# Patient Record
Sex: Male | Born: 1955
Health system: Southern US, Community
[De-identification: ages and names within clinical notes are randomized; demographics above are authoritative.]

## PROBLEM LIST (undated history)

## (undated) DIAGNOSIS — E785 Hyperlipidemia, unspecified: Secondary | ICD-10-CM

## (undated) DIAGNOSIS — I712 Thoracic aortic aneurysm, without rupture, unspecified: Secondary | ICD-10-CM

## (undated) DIAGNOSIS — Q231 Congenital insufficiency of aortic valve: Secondary | ICD-10-CM

## (undated) DIAGNOSIS — I509 Heart failure, unspecified: Secondary | ICD-10-CM

## (undated) DIAGNOSIS — F419 Anxiety disorder, unspecified: Secondary | ICD-10-CM

## (undated) DIAGNOSIS — E291 Testicular hypofunction: Secondary | ICD-10-CM

## (undated) DIAGNOSIS — Q2381 Bicuspid aortic valve: Secondary | ICD-10-CM

## (undated) DIAGNOSIS — C3491 Malignant neoplasm of unspecified part of right bronchus or lung: Secondary | ICD-10-CM

## (undated) DIAGNOSIS — M51369 Other intervertebral disc degeneration, lumbar region without mention of lumbar back pain or lower extremity pain: Secondary | ICD-10-CM

## (undated) DIAGNOSIS — K219 Gastro-esophageal reflux disease without esophagitis: Secondary | ICD-10-CM

## (undated) DIAGNOSIS — I700 Atherosclerosis of aorta: Secondary | ICD-10-CM

## (undated) DIAGNOSIS — G4700 Insomnia, unspecified: Secondary | ICD-10-CM

## (undated) DIAGNOSIS — I350 Nonrheumatic aortic (valve) stenosis: Secondary | ICD-10-CM

## (undated) DIAGNOSIS — M5136 Other intervertebral disc degeneration, lumbar region: Secondary | ICD-10-CM

## (undated) DIAGNOSIS — B1920 Unspecified viral hepatitis C without hepatic coma: Secondary | ICD-10-CM

## (undated) DIAGNOSIS — I10 Essential (primary) hypertension: Secondary | ICD-10-CM

## (undated) DIAGNOSIS — N529 Male erectile dysfunction, unspecified: Secondary | ICD-10-CM

## (undated) DIAGNOSIS — R011 Cardiac murmur, unspecified: Secondary | ICD-10-CM

## (undated) DIAGNOSIS — J449 Chronic obstructive pulmonary disease, unspecified: Secondary | ICD-10-CM

## (undated) DIAGNOSIS — M1990 Unspecified osteoarthritis, unspecified site: Secondary | ICD-10-CM

## (undated) DIAGNOSIS — H53462 Homonymous bilateral field defects, left side: Secondary | ICD-10-CM

## (undated) DIAGNOSIS — I319 Disease of pericardium, unspecified: Secondary | ICD-10-CM

---

## 1998-09-04 ENCOUNTER — Encounter: Admission: RE | Admit: 1998-09-04 | Discharge: 1998-09-04 | Payer: Self-pay | Admitting: *Deleted

## 1998-11-16 HISTORY — PX: ORCHIECTOMY: SHX2116

## 1999-09-03 ENCOUNTER — Ambulatory Visit (HOSPITAL_COMMUNITY): Admission: RE | Admit: 1999-09-03 | Discharge: 1999-09-03 | Payer: Self-pay | Admitting: Gastroenterology

## 1999-09-03 ENCOUNTER — Encounter: Payer: Self-pay | Admitting: Gastroenterology

## 2001-05-16 HISTORY — PX: AORTIC VALVE REPLACEMENT: SHX41

## 2004-10-22 ENCOUNTER — Encounter: Admission: RE | Admit: 2004-10-22 | Discharge: 2004-10-22 | Payer: Self-pay | Admitting: Internal Medicine

## 2004-11-18 ENCOUNTER — Encounter: Admission: RE | Admit: 2004-11-18 | Discharge: 2004-11-18 | Payer: Self-pay | Admitting: Internal Medicine

## 2006-07-28 ENCOUNTER — Encounter: Admission: RE | Admit: 2006-07-28 | Discharge: 2006-07-28 | Payer: Self-pay | Admitting: Internal Medicine

## 2006-08-06 ENCOUNTER — Other Ambulatory Visit: Payer: Self-pay

## 2006-08-11 ENCOUNTER — Ambulatory Visit: Payer: Self-pay | Admitting: Otolaryngology

## 2007-08-03 ENCOUNTER — Encounter: Admission: RE | Admit: 2007-08-03 | Discharge: 2007-08-03 | Payer: Self-pay | Admitting: Internal Medicine

## 2007-12-15 ENCOUNTER — Encounter: Admission: RE | Admit: 2007-12-15 | Discharge: 2007-12-15 | Payer: Self-pay | Admitting: Internal Medicine

## 2008-11-07 DIAGNOSIS — C4492 Squamous cell carcinoma of skin, unspecified: Secondary | ICD-10-CM

## 2008-11-07 HISTORY — DX: Squamous cell carcinoma of skin, unspecified: C44.92

## 2008-12-11 ENCOUNTER — Encounter: Admission: RE | Admit: 2008-12-11 | Discharge: 2008-12-11 | Payer: Self-pay | Admitting: Internal Medicine

## 2010-02-12 ENCOUNTER — Observation Stay: Payer: Self-pay | Admitting: Internal Medicine

## 2010-03-21 ENCOUNTER — Encounter: Admission: RE | Admit: 2010-03-21 | Discharge: 2010-03-21 | Payer: Self-pay | Admitting: Internal Medicine

## 2010-05-14 ENCOUNTER — Ambulatory Visit (HOSPITAL_COMMUNITY): Admission: RE | Admit: 2010-05-14 | Discharge: 2010-05-14 | Payer: Self-pay | Admitting: Internal Medicine

## 2010-11-13 ENCOUNTER — Ambulatory Visit: Payer: Self-pay | Admitting: Gastroenterology

## 2010-12-07 ENCOUNTER — Encounter: Payer: Self-pay | Admitting: Internal Medicine

## 2010-12-08 ENCOUNTER — Encounter: Payer: Self-pay | Admitting: Internal Medicine

## 2011-03-17 ENCOUNTER — Other Ambulatory Visit: Payer: Self-pay | Admitting: Internal Medicine

## 2011-03-17 DIAGNOSIS — M542 Cervicalgia: Secondary | ICD-10-CM

## 2011-03-20 ENCOUNTER — Ambulatory Visit
Admission: RE | Admit: 2011-03-20 | Discharge: 2011-03-20 | Disposition: A | Discharge status: Home / Self Care | Payer: BC Managed Care – PPO | Source: Ambulatory Visit | Attending: Internal Medicine | Admitting: Internal Medicine

## 2011-03-20 DIAGNOSIS — M542 Cervicalgia: Secondary | ICD-10-CM

## 2011-04-20 ENCOUNTER — Encounter: Payer: Self-pay | Admitting: Neurosurgery

## 2011-05-17 ENCOUNTER — Encounter: Payer: Self-pay | Admitting: Neurosurgery

## 2012-07-13 ENCOUNTER — Other Ambulatory Visit: Payer: Self-pay | Admitting: Internal Medicine

## 2012-07-13 DIAGNOSIS — R739 Hyperglycemia, unspecified: Secondary | ICD-10-CM

## 2012-07-19 ENCOUNTER — Ambulatory Visit
Admission: RE | Admit: 2012-07-19 | Discharge: 2012-07-19 | Disposition: A | Discharge status: Home / Self Care | Payer: BC Managed Care – PPO | Source: Ambulatory Visit | Attending: Internal Medicine | Admitting: Internal Medicine

## 2012-07-19 DIAGNOSIS — R739 Hyperglycemia, unspecified: Secondary | ICD-10-CM

## 2012-08-18 ENCOUNTER — Other Ambulatory Visit: Payer: Self-pay | Admitting: Gastroenterology

## 2012-08-18 DIAGNOSIS — G8929 Other chronic pain: Secondary | ICD-10-CM

## 2012-08-24 ENCOUNTER — Ambulatory Visit
Admission: RE | Admit: 2012-08-24 | Discharge: 2012-08-24 | Disposition: A | Discharge status: Home / Self Care | Payer: BC Managed Care – PPO | Source: Ambulatory Visit | Attending: Gastroenterology | Admitting: Gastroenterology

## 2012-08-24 DIAGNOSIS — R1011 Right upper quadrant pain: Secondary | ICD-10-CM

## 2012-08-24 DIAGNOSIS — G8929 Other chronic pain: Secondary | ICD-10-CM

## 2012-08-24 MED ORDER — GADOBENATE DIMEGLUMINE 529 MG/ML IV SOLN
19.0000 mL | Freq: Once | INTRAVENOUS | Status: AC | PRN
  Administered 2012-08-24: 19 mL via INTRAVENOUS

## 2013-06-01 HISTORY — PX: REPAIR THORACIC AORTA: SUR1211

## 2013-06-01 HISTORY — PX: AORTIC VALVE REPLACEMENT: SHX41

## 2014-05-10 ENCOUNTER — Other Ambulatory Visit: Payer: Self-pay | Admitting: Internal Medicine

## 2014-05-10 DIAGNOSIS — R140 Abdominal distension (gaseous): Secondary | ICD-10-CM

## 2014-05-10 DIAGNOSIS — R142 Eructation: Secondary | ICD-10-CM

## 2014-05-10 DIAGNOSIS — R141 Gas pain: Secondary | ICD-10-CM

## 2014-05-10 DIAGNOSIS — R143 Flatulence: Secondary | ICD-10-CM

## 2014-05-22 ENCOUNTER — Ambulatory Visit
Admission: RE | Admit: 2014-05-22 | Discharge: 2014-05-22 | Disposition: A | Discharge status: Home / Self Care | Payer: BC Managed Care – PPO | Source: Ambulatory Visit | Attending: Internal Medicine | Admitting: Internal Medicine

## 2014-05-22 DIAGNOSIS — R140 Abdominal distension (gaseous): Secondary | ICD-10-CM

## 2014-05-22 DIAGNOSIS — R141 Gas pain: Secondary | ICD-10-CM

## 2014-05-22 DIAGNOSIS — R143 Flatulence: Secondary | ICD-10-CM

## 2014-05-22 DIAGNOSIS — R142 Eructation: Secondary | ICD-10-CM

## 2014-05-24 ENCOUNTER — Other Ambulatory Visit: Payer: BC Managed Care – PPO

## 2014-05-24 DIAGNOSIS — B182 Chronic viral hepatitis C: Secondary | ICD-10-CM

## 2014-05-25 LAB — COMPREHENSIVE METABOLIC PANEL
ALK PHOS: 65 U/L (ref 39–117)
ALT: 36 U/L (ref 0–53)
AST: 30 U/L (ref 0–37)
Albumin: 4.2 g/dL (ref 3.5–5.2)
BILIRUBIN TOTAL: 0.6 mg/dL (ref 0.2–1.2)
BUN: 11 mg/dL (ref 6–23)
CO2: 27 mEq/L (ref 19–32)
Calcium: 9.4 mg/dL (ref 8.4–10.5)
Chloride: 100 mEq/L (ref 96–112)
Creat: 0.81 mg/dL (ref 0.50–1.35)
GLUCOSE: 158 mg/dL — AB (ref 70–99)
Potassium: 3.8 mEq/L (ref 3.5–5.3)
SODIUM: 137 meq/L (ref 135–145)
TOTAL PROTEIN: 7.5 g/dL (ref 6.0–8.3)

## 2014-05-25 LAB — HEPATITIS B SURFACE ANTIGEN: HEP B S AG: NEGATIVE

## 2014-05-25 LAB — ANA: ANA: NEGATIVE

## 2014-05-25 LAB — HEPATITIS B SURFACE ANTIBODY,QUALITATIVE: Hep B S Ab: POSITIVE — AB

## 2014-05-25 LAB — HIV ANTIBODY (ROUTINE TESTING W REFLEX): HIV 1&2 Ab, 4th Generation: NONREACTIVE

## 2014-05-25 LAB — HEPATITIS A ANTIBODY, TOTAL: Hep A Total Ab: NONREACTIVE

## 2014-05-25 LAB — PROTIME-INR
INR: 1.12 (ref <1.50)
Prothrombin Time: 14.4 seconds (ref 11.6–15.2)

## 2014-05-25 LAB — HEPATITIS B CORE ANTIBODY, TOTAL: Hep B Core Total Ab: REACTIVE — AB

## 2014-05-25 LAB — IRON: Iron: 144 ug/dL (ref 42–165)

## 2014-05-28 LAB — HEPATITIS C RNA QUANTITATIVE
HCV QUANT LOG: 4.94 {Log} — AB (ref <1.18)
HCV Quantitative: 86942 IU/mL — ABNORMAL HIGH (ref <15)

## 2014-05-30 LAB — HEPATITIS C GENOTYPE

## 2014-06-20 ENCOUNTER — Encounter: Payer: Self-pay | Admitting: Internal Medicine

## 2014-06-20 ENCOUNTER — Ambulatory Visit (INDEPENDENT_AMBULATORY_CARE_PROVIDER_SITE_OTHER): Payer: BC Managed Care – PPO | Admitting: Internal Medicine

## 2014-06-20 VITALS — BP 143/83 | HR 91 | Temp 98.2°F | Ht 76.0 in | Wt 204.0 lb

## 2014-06-20 DIAGNOSIS — G4700 Insomnia, unspecified: Secondary | ICD-10-CM | POA: Diagnosis not present

## 2014-06-20 DIAGNOSIS — B182 Chronic viral hepatitis C: Secondary | ICD-10-CM | POA: Diagnosis not present

## 2014-06-20 DIAGNOSIS — R911 Solitary pulmonary nodule: Secondary | ICD-10-CM

## 2014-06-20 DIAGNOSIS — J309 Allergic rhinitis, unspecified: Secondary | ICD-10-CM | POA: Insufficient documentation

## 2014-06-20 DIAGNOSIS — E785 Hyperlipidemia, unspecified: Secondary | ICD-10-CM | POA: Diagnosis not present

## 2014-06-20 DIAGNOSIS — J302 Other seasonal allergic rhinitis: Secondary | ICD-10-CM

## 2014-06-20 DIAGNOSIS — I10 Essential (primary) hypertension: Secondary | ICD-10-CM

## 2014-06-20 DIAGNOSIS — J3089 Other allergic rhinitis: Secondary | ICD-10-CM

## 2014-06-20 DIAGNOSIS — E291 Testicular hypofunction: Secondary | ICD-10-CM | POA: Insufficient documentation

## 2014-06-20 DIAGNOSIS — R918 Other nonspecific abnormal finding of lung field: Secondary | ICD-10-CM | POA: Insufficient documentation

## 2014-06-20 DIAGNOSIS — I359 Nonrheumatic aortic valve disorder, unspecified: Secondary | ICD-10-CM

## 2014-06-20 DIAGNOSIS — M545 Low back pain, unspecified: Secondary | ICD-10-CM

## 2014-06-20 NOTE — Progress Notes (Signed)
+  Matthew Kelly is a 58 y.o. male who presents for initial evaluation and management of a positive Hepatitis C antibody test.  Patient tested positive in the 1990s. Test was performed as part of an evaluation of concern for hepatitis. Hepatitis C risk factors present are: snorting drug use. Patient denies accidental needle stick, history of blood transfusion, IV drug abuse, multiple sexual partners, renal dialysis, sexual contact with person with liver disease, tattoos. Patient has had other studies performed. Results: hepatitis C RNA by PCR, result: positive. Patient has had prior treatment for Hepatitis C. Patient does not have a past history of liver disease. Patient does not have a family history of liver disease.   HPI: About 6-7 years ago he was treated with PEG/Riba and took for about 4 weeks and stopped due to side effects.  No PI or other meds.    Patient does not have documented immunity to Hepatitis A. Patient does have documented immunity to Hepatitis B.  Getting both vaccines from his GI doctor.   Review of Systems A comprehensive review of systems was negative.   No past medical history on file.  History  Substance Use Topics  . Smoking status: Current Every Day Smoker -- 0.50 packs/day    Types: Cigarettes    Start date: 11/16/1978  . Smokeless tobacco: Never Used  . Alcohol Use: Yes     Comment: occasional    No family history on file.    Objective:   Filed Vitals:   06/20/14 1410  BP: 143/83  Pulse: 91  Temp: 98.2 F (36.8 C)   in no apparent distress and well developed and well nourished HEENT: anicteric Cor RRR and No murmurs clear Bowel sounds are normal, liver is not enlarged, spleen is not enlarged peripheral pulses normal, no pedal edema, no clubbing or cyanosis negative for - jaundice, spider hemangioma, telangiectasia, palmar erythema, ecchymosis and atrophy  Laboratory Genotype:  Lab Results  Component Value Date   HCVGENOTYPE 1a 05/24/2014    HCV viral load:  Lab Results  Component Value Date   HCVQUANT 76283* 05/24/2014   No results found for this basename: WBC, HGB, HCT, MCV, PLT    Lab Results  Component Value Date   CREATININE 0.81 05/24/2014   BUN 11 05/24/2014   NA 137 05/24/2014   K 3.8 05/24/2014   CL 100 05/24/2014   CO2 27 05/24/2014    Lab Results  Component Value Date   ALT 36 05/24/2014   AST 30 05/24/2014   ALKPHOS 65 05/24/2014   BILITOT 0.6 05/24/2014   INR 1.12 05/24/2014      Assessment: Hepatitis C genotype 1a  Plan: 1) Patient counseled extensively on limiting acetaminophen to no more than 2 grams daily, avoidance of alcohol. 2) Transmission discussed with patient including sexual transmission, sharing razors and toothbrush.   3) Will need referral to gastroenterology: No. 4) Will need referral for substance abuse counseling: No. 5) Will prescribe Harvoni for 12 weeks once work up complete with elasography. 6) Hepatitis A vaccine No. 7) Hepatitis B vaccine No. 8) Pneumovax vaccine No., unless F4.

## 2014-06-20 NOTE — Patient Instructions (Signed)
Date DOB  Dear , As discussed in the [facility], your hepatitis C therapy will include the following medications:          Harvoni 90mg /400mg  tablet:           Take 1 tablet by mouth once daily   Please note that ALL MEDICATIONS WILL START ON THE SAME DATE for a total of 12 weeks. ---------------------------------------------------------------- Your HCV Treatment Start Date: TBA   Your HCV genotype:  1a    Liver Fibrosis:  TBA, awaiting Fibroscan/elastography   ---------------------------------------------------------------- YOUR PHARMACY CONTACT:   [place] [facility] and [place] Phone: [phone] Hours: Monday to Friday 7:30 am to 6:00 pm   Please always contact your pharmacy at least 3-4 business days before you run out of medications to ensure your next month's medication is ready or 1 week prior to running out if you receive it by mail.  Remember, each prescription is for 28 days. ---------------------------------------------------------------- GENERAL NOTES REGARDING YOUR HEPATITIS C MEDICATIONS:  SOFOSBUVIR/LEDIPASVIR (HARVONI): - Harvoni tablet is taken daily with OR without food. - The tablets are orange. - The tablets should be stored at room temperature.  - Acid reducing agents such as H2 blockers (ie. Pepcid (famotidine), Zantac (ranitidine), Tagamet (cimetidine), Axid (nizatidine) and proton pump inhibitors (ie. Prilosec (omeprazole), Protonix (pantoprazole), Nexium (esomeprazole), or Aciphex (rabeprazole)) can decrease effectiveness of Harvoni. Do not take until you have discussed with a health care provider.    -Antacids that contain magnesium and/or aluminum hydroxide (ie. Milk of Magensia, Rolaids, Gaviscon, Maalox, Mylanta, an dArthritis Pain Formula)can reduce absorption of Harvoni, so take them at least 4 hours before or after Harvoni.  -Calcium carbonate (calcium supplements or antacids such as  Tums, Caltrate, Os-Cal)needs to be taken at least 4 hours hours before or after Harvoni.  -St. John's wort or any products that contain St. John's wort like some herbal supplements  Please inform the office prior to starting any of these medications.  - The common side effects with Harvoni:      1. Fatigue      2. Headache      3. Nausea      4. Diarrhea      5. Insomnia   Support Path is a suite of resources designed to help patients start with HARVONI and move toward treatment completion [facility] helps patients access therapy and get off to an efficient start  Benefits investigation and prior authorization support Co-pay and other financial assistance A specialty pharmacy finder CO-PAY COUPON The [place] co-pay coupon may help eligible patients lower their out-of-pocket costs. With a co-pay coupon, most eligible patients may pay no more than $5 per co-pay (restrictions apply) www.harvoni.com call (548) 774-9065 Not valid for patients enrolled in government healthcare prescription drug programs, such as Medicare Part D and Medicaid. Patients in the coverage gap known as the "donut hole" also are not eligible The HARVONI co-pay coupon program will cover the out-of-pocket costs for HARVONI prescriptions up to a maximum of 25% of the catalog price of a 12-week regimen of HARVONI  Please note that this only lists the most common side effects and is NOT a comprehensive list of the potential side effects of these medications. For more information, please review the drug information sheets that come with your medication package from the pharmacy.  ---------------------------------------------------------------- GENERAL HELPFUL HINTS ON HCV THERAPY: 1. No alcohol. 2. Protect against sun-sensitivity/sunburns (wear sunglasses, hat, long sleeves,  pants and sunscreen). 3. Stay well-hydrated/well-moisturized. 4. Notify the [facility] of any changes in your other  over-the-counter/herbal or prescription medications. 5. If you miss a dose of your medication, take the missed dose as soon as you remember. Return to your regular time/dose schedule the next day.  6.  Do not stop taking your medications without first talking with your healthcare provider. 7.  You may take Tylenol (acetaminophen), as long as the dose is less than 2000 mg (OR no more than 4 tablets of the Tylenol Extra Strengths 500mg  tablet) in 24 hours. 8.  You will need to obtain routine labs and/or office visits at [facility] at weeks 2, 4, 8,  and 12 as well as 12 and 24 weeks after completion of treatment.  If you are not compliant with labs and office visits, we may discontinue HCV therapy.  Scharlene Gloss, [place] [place], [place]  [phone]

## 2014-06-26 ENCOUNTER — Ambulatory Visit (HOSPITAL_COMMUNITY): Payer: BC Managed Care – PPO

## 2014-06-28 ENCOUNTER — Ambulatory Visit (HOSPITAL_COMMUNITY)
Admission: RE | Admit: 2014-06-28 | Discharge: 2014-06-28 | Disposition: A | Discharge status: Home / Self Care | Payer: BC Managed Care – PPO | Source: Ambulatory Visit | Attending: Internal Medicine | Admitting: Internal Medicine

## 2014-06-28 DIAGNOSIS — B182 Chronic viral hepatitis C: Secondary | ICD-10-CM | POA: Insufficient documentation

## 2014-06-29 ENCOUNTER — Other Ambulatory Visit: Payer: Self-pay | Admitting: Internal Medicine

## 2014-06-29 ENCOUNTER — Other Ambulatory Visit: Payer: Self-pay | Admitting: Licensed Clinical Social Worker

## 2014-06-29 ENCOUNTER — Telehealth: Payer: Self-pay | Admitting: Licensed Clinical Social Worker

## 2014-06-29 MED ORDER — LEDIPASVIR-SOFOSBUVIR 90-400 MG PO TABS: 1.0000 | ORAL_TABLET | Freq: Every day | ORAL | Status: DC

## 2014-06-29 NOTE — Telephone Encounter (Signed)
Patient had elastography u/s done yesterday and got a call today stating that he could pick the Harvoni up from Outpatient pharmacy. Patient would like Dr. Linus Salmons to call him so he could ask questions about possible side effects and his test results. I explained to the patient about his Fibrosis score but he still wanted to speak with Dr. Linus Salmons.

## 2014-07-19 ENCOUNTER — Ambulatory Visit (INDEPENDENT_AMBULATORY_CARE_PROVIDER_SITE_OTHER): Payer: BC Managed Care – PPO | Admitting: Internal Medicine

## 2014-07-19 ENCOUNTER — Telehealth: Payer: Self-pay | Admitting: *Deleted

## 2014-07-19 ENCOUNTER — Encounter: Payer: Self-pay | Admitting: Internal Medicine

## 2014-07-19 VITALS — BP 123/80 | HR 103 | Temp 98.2°F | Wt 205.0 lb

## 2014-07-19 DIAGNOSIS — B182 Chronic viral hepatitis C: Secondary | ICD-10-CM

## 2014-07-19 DIAGNOSIS — K7460 Unspecified cirrhosis of liver: Secondary | ICD-10-CM

## 2014-07-19 DIAGNOSIS — Z23 Encounter for immunization: Secondary | ICD-10-CM | POA: Diagnosis not present

## 2014-07-19 NOTE — Assessment & Plan Note (Addendum)
His MRI in 2013 suggested cirrhosis but elastography F0.  I will refer back to his GI, Dr. Wynetta Emery to evaluate for cirrhosis.  Should he get a liver biopsy?  Do we think it is cirrhosis with discordant results?

## 2014-07-19 NOTE — Addendum Note (Signed)
Addended by: Myrtis Hopping A on: 07/19/2014 02:54 PM   Modules accepted: Orders

## 2014-07-19 NOTE — Progress Notes (Signed)
   Subjective:    Patient ID: Matthew Kelly, male    DOB: [birth year], 58 y.o.   MRN: [id number]  HPI Here for follow up for Hep C.  Genotype 1a, started Harvoni 15 days ago.  No issues and actually feels better. Was denied coverage for elastography as "experimental".  Was F0 though.  MRCP done by Dr. Wynetta Emery previously though with cirrhosis.  Unclear discordance.     Review of Systems  Constitutional: Negative for fatigue.  Gastrointestinal: Negative for diarrhea and abdominal distention.  Skin: Negative for rash.  Neurological: Negative for dizziness and headaches.       Objective:   Physical Exam  Constitutional: He appears well-developed and well-nourished. No distress.  HENT:  Mouth/Throat: No oropharyngeal exudate.  Eyes: No scleral icterus.  Cardiovascular: Normal rate, regular rhythm and normal heart sounds.   No murmur heard. Pulmonary/Chest: Effort normal and breath sounds normal. No respiratory distress.  Skin: No rash noted.          Assessment & Plan:

## 2014-07-19 NOTE — Telephone Encounter (Signed)
Call placed to [facility], Dr. Wynetta Emery for referral to evaluate for cirrhosis. Had to leave message with referral secretary. Office note faxed to Dr. Inda Merlin and Dr. Wynetta Emery at [facility] [place].

## 2014-07-19 NOTE — Assessment & Plan Note (Signed)
He is doing well on Harvoni, now two weeks in.  Labs in 2 weeks including viral load and will continue for 12 weeks total.  Advised on continued compliance.   For his elastography, though is an FDA test, his insurance has denied it as 'experimental'.  He will appeal.

## 2014-08-02 ENCOUNTER — Other Ambulatory Visit: Payer: BC Managed Care – PPO

## 2014-08-02 DIAGNOSIS — Z113 Encounter for screening for infections with a predominantly sexual mode of transmission: Secondary | ICD-10-CM

## 2014-08-02 DIAGNOSIS — B182 Chronic viral hepatitis C: Secondary | ICD-10-CM

## 2014-08-02 LAB — COMPLETE METABOLIC PANEL WITH GFR
ALK PHOS: 57 U/L (ref 39–117)
ALT: 34 U/L (ref 0–53)
AST: 29 U/L (ref 0–37)
Albumin: 4 g/dL (ref 3.5–5.2)
BILIRUBIN TOTAL: 0.6 mg/dL (ref 0.2–1.2)
BUN: 12 mg/dL (ref 6–23)
CO2: 27 mEq/L (ref 19–32)
Calcium: 9.5 mg/dL (ref 8.4–10.5)
Chloride: 103 mEq/L (ref 96–112)
Creat: 0.9 mg/dL (ref 0.50–1.35)
GFR, Est Non African American: >89 mL/min
GLUCOSE: 113 mg/dL — AB (ref 70–99)
Potassium: 5.3 mEq/L (ref 3.5–5.3)
Sodium: 139 mEq/L (ref 135–145)
Total Protein: 7.5 g/dL (ref 6.0–8.3)

## 2014-08-03 LAB — CBC WITH DIFFERENTIAL/PLATELET
Basophils Absolute: 0 10*3/uL (ref 0.0–0.1)
Basophils Relative: 0 % (ref 0–1)
Eosinophils Absolute: 0.1 10*3/uL (ref 0.0–0.7)
Eosinophils Relative: 1 % (ref 0–5)
HEMATOCRIT: 47.9 % (ref 39.0–52.0)
Hemoglobin: 16.9 g/dL (ref 13.0–17.0)
LYMPHS PCT: 45 % (ref 12–46)
Lymphs Abs: 5 10*3/uL — ABNORMAL HIGH (ref 0.7–4.0)
MCH: 31.1 pg (ref 26.0–34.0)
MCHC: 35.3 g/dL (ref 30.0–36.0)
MCV: 88.1 fL (ref 78.0–100.0)
MONO ABS: 0.7 10*3/uL (ref 0.1–1.0)
Monocytes Relative: 6 % (ref 3–12)
NEUTROS ABS: 5.3 10*3/uL (ref 1.7–7.7)
Neutrophils Relative %: 48 % (ref 43–77)
Platelets: 246 10*3/uL (ref 150–400)
RBC: 5.44 MIL/uL (ref 4.22–5.81)
RDW: 13.9 % (ref 11.5–15.5)
WBC: 11.1 10*3/uL — AB (ref 4.0–10.5)

## 2014-08-04 LAB — HEPATITIS C RNA QUANTITATIVE: HCV Quantitative: NOT DETECTED IU/mL (ref <15)

## 2014-08-09 ENCOUNTER — Ambulatory Visit (INDEPENDENT_AMBULATORY_CARE_PROVIDER_SITE_OTHER): Payer: BC Managed Care – PPO | Admitting: Internal Medicine

## 2014-08-09 ENCOUNTER — Encounter: Payer: Self-pay | Admitting: Internal Medicine

## 2014-08-09 VITALS — BP 143/90 | HR 106 | Temp 98.0°F | Wt 203.0 lb

## 2014-08-09 DIAGNOSIS — Z23 Encounter for immunization: Secondary | ICD-10-CM

## 2014-08-09 DIAGNOSIS — B182 Chronic viral hepatitis C: Secondary | ICD-10-CM

## 2014-08-09 NOTE — Progress Notes (Signed)
   Subjective:    Patient ID: Matthew Kelly, male    DOB: 1956-03-13, 58 y.o.   MRN: [id number]  HPI Here for follow up for Hep C.  Genotype 1a, started Harvoni 6 weeks ago.  No issues and actually feels better. Was denied coverage for elastography as "experimental" but was appealed and seems to have been covered now.  Was F0 though.  MRCP done by Dr. Wynetta Emery previously though with cirrhosis.  Unclear discordance.  To get follow up with Dr. Wynetta Emery, ? Cirrhosis, ? Biopsy    Review of Systems  Constitutional: Negative for fatigue.  Gastrointestinal: Negative for diarrhea and abdominal distention.  Skin: Negative for rash.  Neurological: Negative for dizziness and headaches.       Objective:   Physical Exam  Constitutional: He appears well-developed and well-nourished. No distress.  HENT:  Mouth/Throat: No oropharyngeal exudate.  Eyes: No scleral icterus.  Cardiovascular: Normal rate, regular rhythm and normal heart sounds.   No murmur heard. Pulmonary/Chest: Effort normal and breath sounds normal. No respiratory distress.  Skin: No rash noted.          Assessment & Plan:

## 2014-08-09 NOTE — Assessment & Plan Note (Signed)
Doing great.  Now undetectable.  Will recheck viral load after treatment.  Discussed with pt that cure determined after SVR12.  RTC after labs.

## 2014-09-08 ENCOUNTER — Encounter: Payer: Self-pay | Admitting: *Deleted

## 2014-09-27 ENCOUNTER — Other Ambulatory Visit: Payer: BC Managed Care – PPO

## 2014-09-27 DIAGNOSIS — B182 Chronic viral hepatitis C: Secondary | ICD-10-CM

## 2014-09-28 LAB — HEPATITIS C RNA QUANTITATIVE: HCV Quantitative: NOT DETECTED IU/mL (ref <15)

## 2014-10-04 ENCOUNTER — Ambulatory Visit (INDEPENDENT_AMBULATORY_CARE_PROVIDER_SITE_OTHER): Payer: BC Managed Care – PPO | Admitting: Internal Medicine

## 2014-10-04 ENCOUNTER — Encounter: Payer: Self-pay | Admitting: Internal Medicine

## 2014-10-04 VITALS — BP 129/78 | HR 79 | Temp 98.4°F | Ht 76.0 in | Wt 201.2 lb

## 2014-10-04 DIAGNOSIS — K7460 Unspecified cirrhosis of liver: Secondary | ICD-10-CM

## 2014-10-04 DIAGNOSIS — B182 Chronic viral hepatitis C: Secondary | ICD-10-CM

## 2014-10-04 NOTE — Assessment & Plan Note (Signed)
GI does not feel it is consistent with cirrhosis.

## 2014-10-04 NOTE — Assessment & Plan Note (Signed)
Doing great.  Will check SVR 12 next visit and discussed rebound.  RTC 1 week after lab.

## 2014-10-04 NOTE — Progress Notes (Signed)
   Subjective:    Patient ID: Matthew Kelly, male    DOB: Apr 28, 1956, 57 y.o.   MRN: [id number]  HPI Here for follow up for Hep C.  Genotype 1a, started Harvoni 6 weeks ago.  No issues and actually feels better. Was denied coverage for elastography as "experimental" but was appealed and seems to have been covered now.  Was F0 though.  MRCP done by Dr. Wynetta Emery previously though with cirrhosis.  Unclear discordance.  To get follow up with Dr. Wynetta Emery, ? Cirrhosis, ? Biopsy    Review of Systems  Constitutional: Negative for fatigue.  Gastrointestinal: Negative for diarrhea and abdominal distention.  Skin: Negative for rash.  Neurological: Negative for dizziness and headaches.       Objective:   Physical Exam  Constitutional: He appears well-developed and well-nourished. No distress.  Eyes: No scleral icterus.  Cardiovascular: Normal rate, regular rhythm and normal heart sounds.   No murmur heard. Pulmonary/Chest: Effort normal and breath sounds normal. No respiratory distress.  Skin: No rash noted.          Assessment & Plan:

## 2014-12-24 ENCOUNTER — Other Ambulatory Visit: Payer: BC Managed Care – PPO

## 2014-12-25 ENCOUNTER — Other Ambulatory Visit: Payer: Self-pay

## 2014-12-26 ENCOUNTER — Other Ambulatory Visit: Payer: BLUE CROSS/BLUE SHIELD

## 2014-12-26 DIAGNOSIS — B182 Chronic viral hepatitis C: Secondary | ICD-10-CM

## 2014-12-27 LAB — HEPATITIS C RNA QUANTITATIVE: HCV QUANT: NOT DETECTED [IU]/mL (ref <15)

## 2014-12-29 ENCOUNTER — Encounter: Payer: Self-pay | Admitting: Internal Medicine

## 2015-01-08 ENCOUNTER — Encounter: Payer: Self-pay | Admitting: Internal Medicine

## 2015-01-08 ENCOUNTER — Ambulatory Visit (INDEPENDENT_AMBULATORY_CARE_PROVIDER_SITE_OTHER): Payer: BLUE CROSS/BLUE SHIELD | Admitting: Internal Medicine

## 2015-01-08 VITALS — BP 134/84 | HR 86 | Temp 98.5°F | Ht 76.0 in | Wt 203.0 lb

## 2015-01-08 DIAGNOSIS — B182 Chronic viral hepatitis C: Secondary | ICD-10-CM

## 2015-01-08 NOTE — Assessment & Plan Note (Signed)
He has done well and has completed treatment and is now considered cured. I will have him return in 1 year for follow-up elastography to assure that his elastography score is at the F0 level and not more concerning. I suspect the discordance is an anomaly on the MRI and not truly cirrhosis, particularly with normal platelets and albumin.

## 2015-01-08 NOTE — Progress Notes (Signed)
   Subjective:    Patient ID: Matthew Kelly, male    DOB: 1956/07/18, 59 y.o.   MRN: [id number]  HPI Here for follow up for Hep C.  Genotype 1a, finished Harvoni 3 months ago.  No issues and actually feels better. Was denied coverage for elastography as "experimental" but was appealed and seems to have been covered now.  Was F0 though.  MRCP done by Dr. Wynetta Emery previously though with cirrhosis.  Unclear discordance.  Had follow up with Dr. Wynetta Emery and does not feel it is cirrhosis.     Review of Systems  Constitutional: Negative for fatigue.  Gastrointestinal: Negative for diarrhea and abdominal distention.  Skin: Negative for rash.  Neurological: Negative for dizziness and headaches.       Objective:   Physical Exam  Constitutional: He appears well-developed and well-nourished. No distress.  Eyes: No scleral icterus.  Cardiovascular: Normal rate, regular rhythm and normal heart sounds.   No murmur heard. Pulmonary/Chest: Effort normal and breath sounds normal. No respiratory distress.  Skin: No rash noted.          Assessment & Plan:

## 2015-10-17 ENCOUNTER — Other Ambulatory Visit: Payer: Self-pay | Admitting: Gastroenterology

## 2015-10-17 DIAGNOSIS — R1084 Generalized abdominal pain: Secondary | ICD-10-CM

## 2015-10-25 ENCOUNTER — Ambulatory Visit
Admission: RE | Admit: 2015-10-25 | Discharge: 2015-10-25 | Disposition: A | Discharge status: Home / Self Care | Payer: BLUE CROSS/BLUE SHIELD | Source: Ambulatory Visit | Attending: Gastroenterology | Admitting: Gastroenterology

## 2015-10-25 DIAGNOSIS — R1084 Generalized abdominal pain: Secondary | ICD-10-CM

## 2015-12-10 ENCOUNTER — Telehealth: Payer: Self-pay | Admitting: *Deleted

## 2015-12-10 ENCOUNTER — Other Ambulatory Visit: Payer: Self-pay | Admitting: *Deleted

## 2015-12-10 DIAGNOSIS — B182 Chronic viral hepatitis C: Secondary | ICD-10-CM

## 2015-12-10 NOTE — Telephone Encounter (Signed)
Left message asking patient to call back.  Patient is due for 1 year follow up with Dr. Linus Salmons.  He will first need elastography.  RN asked patient to confirm insurance information for prior authorization of elastography.  Imaging and follow up with Dr Linus Salmons will then need to be scheduled according to the patient's availability. Landis Gandy, RN

## 2016-01-14 ENCOUNTER — Ambulatory Visit (HOSPITAL_COMMUNITY): Payer: BLUE CROSS/BLUE SHIELD

## 2016-01-21 ENCOUNTER — Ambulatory Visit: Payer: BLUE CROSS/BLUE SHIELD | Admitting: Internal Medicine

## 2016-01-29 ENCOUNTER — Ambulatory Visit (HOSPITAL_COMMUNITY)
Admission: RE | Admit: 2016-01-29 | Discharge: 2016-01-29 | Disposition: A | Discharge status: Home / Self Care | Payer: BLUE CROSS/BLUE SHIELD | Source: Ambulatory Visit | Attending: Internal Medicine | Admitting: Internal Medicine

## 2016-01-29 ENCOUNTER — Telehealth: Payer: Self-pay | Admitting: *Deleted

## 2016-01-29 DIAGNOSIS — B182 Chronic viral hepatitis C: Secondary | ICD-10-CM

## 2016-01-29 DIAGNOSIS — N281 Cyst of kidney, acquired: Secondary | ICD-10-CM | POA: Insufficient documentation

## 2016-01-29 DIAGNOSIS — I77811 Abdominal aortic ectasia: Secondary | ICD-10-CM | POA: Diagnosis not present

## 2016-01-29 NOTE — Telephone Encounter (Signed)
Patient had his elastography this morning, next available follow up appointment is 5/2.  As of now he will keep the appointment, but will be looking for results on MyChart. Landis Gandy, RN

## 2016-01-29 NOTE — Telephone Encounter (Signed)
Shows some damage but not concerning and he really won't need further liver follow up.  thanks

## 2016-01-30 NOTE — Telephone Encounter (Signed)
Patient is pleased that he won't need future [facility] management. He will keep his appointment to make sure that all of his questions are answered before he leaves [facility]. He states that he feels Dr. Linus Salmons is "really easy to talk to and will give me the information I need."  When he comes 5/2, he would like a print out of all ultrasound reports and office notes to bring to his PCP for future follow up. Thanks!

## 2016-02-05 ENCOUNTER — Telehealth: Payer: Self-pay | Admitting: *Deleted

## 2016-02-05 NOTE — Telephone Encounter (Signed)
Pt has a question about ultrasound results/Impression regarding the comment about "abdominal aorta."   "Ectatic abdominal aorta at risk for aneurysm development.  Recommend followup by ultrasound in 5 years. This recommendation follows ACR consensus guidelines: White Paper of the ACR Incidental Findings Committee II on Vascular Findings. J Am Coll Radiol 2013; 10:789-794."   Pt would appreciate a call.  He stated that he had heart surgery a couple of years ago for valve replacement and aneurysm repair.  This comment on the U/S findings is concerning to him and he is wondering if there is anything he needs to do now.

## 2016-02-05 NOTE — Telephone Encounter (Signed)
I don't have time to call the patient, nothing concerning and he should talk to his PCP about it. thanks

## 2016-02-11 NOTE — Telephone Encounter (Signed)
Shared Dr. Henreitta Leber comments.

## 2016-02-21 DIAGNOSIS — M545 Low back pain: Secondary | ICD-10-CM | POA: Diagnosis not present

## 2016-02-21 DIAGNOSIS — M47816 Spondylosis without myelopathy or radiculopathy, lumbar region: Secondary | ICD-10-CM | POA: Diagnosis not present

## 2016-02-21 DIAGNOSIS — M25511 Pain in right shoulder: Secondary | ICD-10-CM | POA: Diagnosis not present

## 2016-02-21 DIAGNOSIS — S46911A Strain of unspecified muscle, fascia and tendon at shoulder and upper arm level, right arm, initial encounter: Secondary | ICD-10-CM | POA: Diagnosis not present

## 2016-03-12 ENCOUNTER — Other Ambulatory Visit: Payer: Self-pay | Admitting: Surgery

## 2016-03-12 DIAGNOSIS — S46911D Strain of unspecified muscle, fascia and tendon at shoulder and upper arm level, right arm, subsequent encounter: Secondary | ICD-10-CM

## 2016-03-12 DIAGNOSIS — S39012D Strain of muscle, fascia and tendon of lower back, subsequent encounter: Secondary | ICD-10-CM

## 2016-03-12 DIAGNOSIS — M47816 Spondylosis without myelopathy or radiculopathy, lumbar region: Secondary | ICD-10-CM

## 2016-03-16 ENCOUNTER — Other Ambulatory Visit: Payer: Self-pay | Admitting: Surgery

## 2016-03-16 DIAGNOSIS — M47816 Spondylosis without myelopathy or radiculopathy, lumbar region: Secondary | ICD-10-CM

## 2016-03-16 DIAGNOSIS — S39012D Strain of muscle, fascia and tendon of lower back, subsequent encounter: Secondary | ICD-10-CM

## 2016-03-16 DIAGNOSIS — S46911D Strain of unspecified muscle, fascia and tendon at shoulder and upper arm level, right arm, subsequent encounter: Secondary | ICD-10-CM | POA: Diagnosis not present

## 2016-03-17 ENCOUNTER — Ambulatory Visit: Payer: BLUE CROSS/BLUE SHIELD | Admitting: Internal Medicine

## 2016-03-23 ENCOUNTER — Ambulatory Visit
Admission: RE | Admit: 2016-03-23 | Discharge: 2016-03-23 | Disposition: A | Discharge status: Home / Self Care | Payer: BLUE CROSS/BLUE SHIELD | Source: Ambulatory Visit | Attending: Surgery | Admitting: Surgery

## 2016-03-23 DIAGNOSIS — M75111 Incomplete rotator cuff tear or rupture of right shoulder, not specified as traumatic: Secondary | ICD-10-CM | POA: Diagnosis not present

## 2016-03-23 DIAGNOSIS — M4806 Spinal stenosis, lumbar region: Secondary | ICD-10-CM | POA: Diagnosis not present

## 2016-03-23 DIAGNOSIS — S39012D Strain of muscle, fascia and tendon of lower back, subsequent encounter: Secondary | ICD-10-CM

## 2016-03-23 DIAGNOSIS — S46911D Strain of unspecified muscle, fascia and tendon at shoulder and upper arm level, right arm, subsequent encounter: Secondary | ICD-10-CM

## 2016-03-23 DIAGNOSIS — M47816 Spondylosis without myelopathy or radiculopathy, lumbar region: Secondary | ICD-10-CM

## 2016-03-27 DIAGNOSIS — M7580 Other shoulder lesions, unspecified shoulder: Secondary | ICD-10-CM | POA: Diagnosis not present

## 2016-03-27 DIAGNOSIS — M5416 Radiculopathy, lumbar region: Secondary | ICD-10-CM | POA: Diagnosis not present

## 2016-03-27 DIAGNOSIS — M5126 Other intervertebral disc displacement, lumbar region: Secondary | ICD-10-CM | POA: Diagnosis not present

## 2016-03-30 DIAGNOSIS — M47816 Spondylosis without myelopathy or radiculopathy, lumbar region: Secondary | ICD-10-CM | POA: Diagnosis not present

## 2016-03-30 DIAGNOSIS — S46911D Strain of unspecified muscle, fascia and tendon at shoulder and upper arm level, right arm, subsequent encounter: Secondary | ICD-10-CM | POA: Diagnosis not present

## 2016-03-30 DIAGNOSIS — S39012D Strain of muscle, fascia and tendon of lower back, subsequent encounter: Secondary | ICD-10-CM | POA: Diagnosis not present

## 2016-03-30 DIAGNOSIS — M7581 Other shoulder lesions, right shoulder: Secondary | ICD-10-CM | POA: Diagnosis not present

## 2016-04-10 DIAGNOSIS — I712 Thoracic aortic aneurysm, without rupture: Secondary | ICD-10-CM | POA: Diagnosis not present

## 2016-04-10 DIAGNOSIS — Z8679 Personal history of other diseases of the circulatory system: Secondary | ICD-10-CM | POA: Diagnosis not present

## 2016-04-10 DIAGNOSIS — Z9889 Other specified postprocedural states: Secondary | ICD-10-CM | POA: Diagnosis not present

## 2016-04-21 DIAGNOSIS — M5416 Radiculopathy, lumbar region: Secondary | ICD-10-CM | POA: Diagnosis not present

## 2016-04-21 DIAGNOSIS — M5126 Other intervertebral disc displacement, lumbar region: Secondary | ICD-10-CM | POA: Diagnosis not present

## 2016-05-05 DIAGNOSIS — M5126 Other intervertebral disc displacement, lumbar region: Secondary | ICD-10-CM | POA: Diagnosis not present

## 2016-07-02 DIAGNOSIS — L400 Psoriasis vulgaris: Secondary | ICD-10-CM | POA: Diagnosis not present

## 2016-07-02 DIAGNOSIS — M5126 Other intervertebral disc displacement, lumbar region: Secondary | ICD-10-CM | POA: Diagnosis not present

## 2016-07-02 DIAGNOSIS — M7580 Other shoulder lesions, unspecified shoulder: Secondary | ICD-10-CM | POA: Diagnosis not present

## 2016-07-02 DIAGNOSIS — D1800 Hemangioma unspecified site: Secondary | ICD-10-CM | POA: Diagnosis not present

## 2016-07-02 DIAGNOSIS — R739 Hyperglycemia, unspecified: Secondary | ICD-10-CM | POA: Diagnosis not present

## 2016-07-02 DIAGNOSIS — D229 Melanocytic nevi, unspecified: Secondary | ICD-10-CM | POA: Diagnosis not present

## 2016-07-02 DIAGNOSIS — L720 Epidermal cyst: Secondary | ICD-10-CM | POA: Diagnosis not present

## 2016-07-02 DIAGNOSIS — K589 Irritable bowel syndrome without diarrhea: Secondary | ICD-10-CM | POA: Diagnosis not present

## 2016-07-07 DIAGNOSIS — M5126 Other intervertebral disc displacement, lumbar region: Secondary | ICD-10-CM | POA: Diagnosis not present

## 2016-07-07 DIAGNOSIS — D72820 Lymphocytosis (symptomatic): Secondary | ICD-10-CM | POA: Diagnosis not present

## 2016-07-07 DIAGNOSIS — E291 Testicular hypofunction: Secondary | ICD-10-CM | POA: Diagnosis not present

## 2016-07-07 DIAGNOSIS — R739 Hyperglycemia, unspecified: Secondary | ICD-10-CM | POA: Diagnosis not present

## 2016-07-07 DIAGNOSIS — E782 Mixed hyperlipidemia: Secondary | ICD-10-CM | POA: Diagnosis not present

## 2016-07-28 DIAGNOSIS — Q231 Congenital insufficiency of aortic valve: Secondary | ICD-10-CM | POA: Diagnosis not present

## 2016-08-05 DIAGNOSIS — I359 Nonrheumatic aortic valve disorder, unspecified: Secondary | ICD-10-CM | POA: Diagnosis not present

## 2016-08-05 DIAGNOSIS — E782 Mixed hyperlipidemia: Secondary | ICD-10-CM | POA: Diagnosis not present

## 2016-08-05 DIAGNOSIS — Q231 Congenital insufficiency of aortic valve: Secondary | ICD-10-CM | POA: Diagnosis not present

## 2016-08-05 DIAGNOSIS — I10 Essential (primary) hypertension: Secondary | ICD-10-CM | POA: Diagnosis not present

## 2016-09-14 DIAGNOSIS — M79671 Pain in right foot: Secondary | ICD-10-CM | POA: Diagnosis not present

## 2016-09-14 DIAGNOSIS — S93601A Unspecified sprain of right foot, initial encounter: Secondary | ICD-10-CM | POA: Diagnosis not present

## 2016-09-25 DIAGNOSIS — M7671 Peroneal tendinitis, right leg: Secondary | ICD-10-CM | POA: Diagnosis not present

## 2016-09-25 DIAGNOSIS — M7581 Other shoulder lesions, right shoulder: Secondary | ICD-10-CM | POA: Diagnosis not present

## 2016-09-25 DIAGNOSIS — S46911D Strain of unspecified muscle, fascia and tendon at shoulder and upper arm level, right arm, subsequent encounter: Secondary | ICD-10-CM | POA: Diagnosis not present

## 2016-10-05 DIAGNOSIS — M216X1 Other acquired deformities of right foot: Secondary | ICD-10-CM | POA: Diagnosis not present

## 2016-10-05 DIAGNOSIS — M659 Synovitis and tenosynovitis, unspecified: Secondary | ICD-10-CM | POA: Diagnosis not present

## 2016-10-14 ENCOUNTER — Other Ambulatory Visit: Payer: Self-pay | Admitting: Gastroenterology

## 2016-11-17 DIAGNOSIS — M659 Synovitis and tenosynovitis, unspecified: Secondary | ICD-10-CM | POA: Diagnosis not present

## 2016-11-17 DIAGNOSIS — M216X1 Other acquired deformities of right foot: Secondary | ICD-10-CM | POA: Diagnosis not present

## 2016-12-14 DIAGNOSIS — S90852A Superficial foreign body, left foot, initial encounter: Secondary | ICD-10-CM | POA: Diagnosis not present

## 2016-12-14 DIAGNOSIS — M216X1 Other acquired deformities of right foot: Secondary | ICD-10-CM | POA: Diagnosis not present

## 2016-12-14 DIAGNOSIS — M659 Synovitis and tenosynovitis, unspecified: Secondary | ICD-10-CM | POA: Diagnosis not present

## 2016-12-30 ENCOUNTER — Encounter (HOSPITAL_COMMUNITY): Payer: Self-pay | Admitting: *Deleted

## 2017-01-03 ENCOUNTER — Encounter (HOSPITAL_COMMUNITY): Payer: Self-pay | Admitting: Anesthesiology

## 2017-01-03 NOTE — Anesthesia Preprocedure Evaluation (Addendum)
Anesthesia Evaluation  Patient identified by MRN, date of birth, ID band Patient awake    Reviewed: Allergy & Precautions, NPO status , Patient's Chart, lab work & pertinent test results, reviewed documented beta blocker date and time   Airway Mallampati: I  TM Distance: >3 FB Neck ROM: Full    Dental  (+) Teeth Intact, Dental Advisory Given   Pulmonary Current Smoker,    breath sounds clear to auscultation       Cardiovascular hypertension, Pt. on medications and Pt. on home beta blockers  Rhythm:Regular Rate:Normal     Neuro/Psych negative neurological ROS  negative psych ROS   GI/Hepatic GERD  Medicated,(+) Hepatitis -, C  Endo/Other    Renal/GU   negative genitourinary   Musculoskeletal negative musculoskeletal ROS (+)   Abdominal   Peds negative pediatric ROS (+)  Hematology negative hematology ROS (+)   Anesthesia Other Findings   Reproductive/Obstetrics negative OB ROS                            Anesthesia Physical Anesthesia Plan  ASA: II  Anesthesia Plan: MAC   Post-op Pain Management:    Induction: Intravenous  Airway Management Planned: Natural Airway  Additional Equipment:   Intra-op Plan:   Post-operative Plan:   Informed Consent: I have reviewed the patients History and Physical, chart, labs and discussed the procedure including the risks, benefits and alternatives for the proposed anesthesia with the patient or authorized representative who has indicated his/her understanding and acceptance.     Plan Discussed with: CRNA  Anesthesia Plan Comments:        Anesthesia Quick Evaluation

## 2017-01-04 ENCOUNTER — Ambulatory Visit (HOSPITAL_COMMUNITY)
Admission: RE | Admit: 2017-01-04 | Discharge: 2017-01-04 | Disposition: A | Discharge status: Home / Self Care | Payer: BLUE CROSS/BLUE SHIELD | Source: Ambulatory Visit | Attending: Gastroenterology | Admitting: Gastroenterology

## 2017-01-04 ENCOUNTER — Ambulatory Visit (HOSPITAL_COMMUNITY): Payer: BLUE CROSS/BLUE SHIELD | Admitting: Anesthesiology

## 2017-01-04 ENCOUNTER — Encounter (HOSPITAL_COMMUNITY)
Admission: RE | Disposition: A | Discharge status: Home / Self Care | Payer: Self-pay | Source: Ambulatory Visit | Attending: Gastroenterology

## 2017-01-04 ENCOUNTER — Encounter (HOSPITAL_COMMUNITY): Payer: Self-pay

## 2017-01-04 DIAGNOSIS — Z86010 Personal history of colonic polyps: Secondary | ICD-10-CM | POA: Diagnosis not present

## 2017-01-04 DIAGNOSIS — Z8619 Personal history of other infectious and parasitic diseases: Secondary | ICD-10-CM | POA: Insufficient documentation

## 2017-01-04 DIAGNOSIS — Z7982 Long term (current) use of aspirin: Secondary | ICD-10-CM | POA: Insufficient documentation

## 2017-01-04 DIAGNOSIS — K219 Gastro-esophageal reflux disease without esophagitis: Secondary | ICD-10-CM | POA: Insufficient documentation

## 2017-01-04 DIAGNOSIS — Z953 Presence of xenogenic heart valve: Secondary | ICD-10-CM | POA: Insufficient documentation

## 2017-01-04 DIAGNOSIS — Z1211 Encounter for screening for malignant neoplasm of colon: Secondary | ICD-10-CM | POA: Insufficient documentation

## 2017-01-04 DIAGNOSIS — J309 Allergic rhinitis, unspecified: Secondary | ICD-10-CM | POA: Diagnosis not present

## 2017-01-04 DIAGNOSIS — Z79899 Other long term (current) drug therapy: Secondary | ICD-10-CM | POA: Diagnosis not present

## 2017-01-04 DIAGNOSIS — F17200 Nicotine dependence, unspecified, uncomplicated: Secondary | ICD-10-CM | POA: Diagnosis not present

## 2017-01-04 DIAGNOSIS — I10 Essential (primary) hypertension: Secondary | ICD-10-CM | POA: Diagnosis not present

## 2017-01-04 DIAGNOSIS — E7800 Pure hypercholesterolemia, unspecified: Secondary | ICD-10-CM | POA: Diagnosis not present

## 2017-01-04 HISTORY — DX: Essential (primary) hypertension: I10

## 2017-01-04 HISTORY — PX: COLONOSCOPY WITH PROPOFOL: SHX5780

## 2017-01-04 HISTORY — DX: Unspecified osteoarthritis, unspecified site: M19.90

## 2017-01-04 HISTORY — DX: Cardiac murmur, unspecified: R01.1

## 2017-01-04 HISTORY — DX: Anxiety disorder, unspecified: F41.9

## 2017-01-04 HISTORY — DX: Gastro-esophageal reflux disease without esophagitis: K21.9

## 2017-01-04 SURGERY — COLONOSCOPY WITH PROPOFOL
Anesthesia: Monitor Anesthesia Care

## 2017-01-04 MED ORDER — LACTATED RINGERS IV SOLN
INTRAVENOUS | Status: DC
Start: 2017-01-04 — End: 2017-01-04
  Administered 2017-01-04: 07:00:00 via INTRAVENOUS

## 2017-01-04 MED ORDER — LIDOCAINE 2% (20 MG/ML) 5 ML SYRINGE
INTRAMUSCULAR | Status: DC | PRN
  Administered 2017-01-04: 60 mg via INTRAVENOUS

## 2017-01-04 MED ORDER — PROPOFOL 10 MG/ML IV BOLUS
INTRAVENOUS | Status: DC | PRN
  Administered 2017-01-04 (×2): 30 mg via INTRAVENOUS
  Administered 2017-01-04 (×2): 20 mg via INTRAVENOUS

## 2017-01-04 MED ORDER — SODIUM CHLORIDE 0.9 % IV SOLN: INTRAVENOUS | Status: DC

## 2017-01-04 MED ORDER — PROPOFOL 500 MG/50ML IV EMUL
INTRAVENOUS | Status: DC | PRN
  Administered 2017-01-04: 125 ug/kg/min via INTRAVENOUS

## 2017-01-04 MED ORDER — LIDOCAINE 2% (20 MG/ML) 5 ML SYRINGE
INTRAMUSCULAR | Status: AC
  Filled 2017-01-04: qty 5

## 2017-01-04 MED ORDER — PROPOFOL 10 MG/ML IV BOLUS
INTRAVENOUS | Status: AC
  Filled 2017-01-04: qty 20

## 2017-01-04 MED ORDER — PROPOFOL 10 MG/ML IV BOLUS
INTRAVENOUS | Status: AC
  Filled 2017-01-04: qty 60

## 2017-01-04 SURGICAL SUPPLY — 22 items

## 2017-01-04 NOTE — Op Note (Signed)
[facility] Patient Name: Matthew Kelly Procedure Date: 01/04/2017 MRN: [id number] Attending MD: Garlan Fair , MD Date of Birth: 09-15-1956 CSN: [id number] Age: 61 Admit Type: Outpatient Procedure:                Colonoscopy Indications:              High risk colon cancer surveillance: Personal                            history of adenoma less than 10 mm in size Providers:                Garlan Fair, MD, Jobe Igo, RN, Janie                            Billups, Technician, Dione Booze, CRNA Referring MD:              Medicines:                Propofol per Anesthesia Complications:            No immediate complications. Estimated Blood Loss:     Estimated blood loss was minimal. Procedure:                Pre-Anesthesia Assessment:                           - Prior to the procedure, a History and Physical                            was performed, and patient medications and                            allergies were reviewed. The patient's tolerance of                            previous anesthesia was also reviewed. The risks                            and benefits of the procedure and the sedation                            options and risks were discussed with the patient.                            All questions were answered, and informed consent                            was obtained. Prior Anticoagulants: The patient has                            taken aspirin, last dose was day of procedure. ASA                            Grade Assessment: II - A patient with mild systemic  disease. After reviewing the risks and benefits,                            the patient was deemed in satisfactory condition to                            undergo the procedure.                           After obtaining informed consent, the colonoscope                            was passed under direct vision. Throughout the             procedure, the patient's blood pressure, pulse, and                            oxygen saturations were monitored continuously. The                            EC-[id number] ([id number]) scope was introduced through                            the anus and advanced to the the cecum, identified                            by appendiceal orifice and ileocecal valve. The                            colonoscopy was performed without difficulty. The                            patient tolerated the procedure well. The quality                            of the bowel preparation was adequate. The                            appendiceal orifice and the rectum were                            photographed. Scope In: 8:04:52 AM Scope Out: 8:27:43 AM Scope Withdrawal Time: 0 hours 18 minutes 5 seconds  Total Procedure Duration: 0 hours 22 minutes 51 seconds  Findings:      The perianal and digital rectal examinations were normal.      The entire examined colon appeared normal. Universal colonic       diverticulosis was present. Random colon biopsies were performed from       the right and left colon to look of microscopic colitis Impression:               - The entire examined colon is normal.                           - Random colon biopsies were performed to look for  microscopic clitis. Moderate Sedation:      N/A- Per Anesthesia Care Recommendation:           - Patient has a contact number available for                            emergencies. The signs and symptoms of potential                            delayed complications were discussed with the                            patient. Return to normal activities tomorrow.                            Written discharge instructions were provided to the                            patient.                           - Repeat colonoscopy in 5 years for surveillance.                           - Resume previous diet.                            - Continue present medications. Procedure Code(s):        --- Professional ---                           M3817, Colorectal cancer screening; colonoscopy on                            individual at high risk Diagnosis Code(s):        --- Professional ---                           Z86.010, Personal history of colonic polyps CPT copyright 2016 American Medical Association. All rights reserved. The codes documented in this report are preliminary and upon coder review may  be revised to meet current compliance requirements. Earle Gell, MD Garlan Fair, MD 01/04/2017 8:36:24 AM This report has been signed electronically. Number of Addenda: 0

## 2017-01-04 NOTE — Transfer of Care (Signed)
Immediate Anesthesia Transfer of Care Note  Patient: Enedina Finner  Procedure(s) Performed: Procedure(s): COLONOSCOPY WITH PROPOFOL (N/A)  Patient Location: PACU and Endoscopy Unit  Anesthesia Type:MAC  Level of Consciousness: awake, sedated and patient cooperative  Airway & Oxygen Therapy: Patient Spontanous Breathing and Patient connected to face mask oxygen  Post-op Assessment: Report given to RN and Post -op Vital signs reviewed and stable  Post vital signs: Reviewed and stable  Last Vitals:  Vitals:   01/04/17 0704 01/04/17 0834  BP: 131/77   Pulse: 74 67  Resp: 11 17  Temp: 36.5 C     Last Pain:  Vitals:   01/04/17 0704  TempSrc: Oral         Complications: No apparent anesthesia complications

## 2017-01-04 NOTE — Discharge Instructions (Signed)

## 2017-01-04 NOTE — Anesthesia Postprocedure Evaluation (Addendum)
Anesthesia Post Note  Patient: Matthew Kelly  Procedure(s) Performed: Procedure(s) (LRB): COLONOSCOPY WITH PROPOFOL (N/A)  Patient location during evaluation: PACU Anesthesia Type: MAC Level of consciousness: awake and alert Pain management: pain level controlled Vital Signs Assessment: post-procedure vital signs reviewed and stable Respiratory status: spontaneous breathing, nonlabored ventilation, respiratory function stable and patient connected to nasal cannula oxygen Cardiovascular status: stable and blood pressure returned to baseline Anesthetic complications: no       Last Vitals:  Vitals:   01/04/17 0704 01/04/17 0834  BP: 131/77   Pulse: 74 67  Resp: 11 17  Temp: 36.5 C 36.5 C    Last Pain:  Vitals:   01/04/17 0834  TempSrc: Oral                 Effie Berkshire

## 2017-01-04 NOTE — H&P (Signed)
Procedure: Surveillance colonoscopy. 11/03/2011 normal surveillance colonoscopy was performed. 2007 colonoscopy was performed with removal of small adenomatous colon polyps  History: The patient is a 61 year old male born [birth year]. He is scheduled to undergo a surveillance colonoscopy today. Yesterday, he developed left lower quadrant abdominal pain that lasted approximately 4 hours before completely resolving. He reports no abdominal pain. He has chronic right sided abdominal pain after eating. His abdominal ultrasound did not show gallstones.  Past medical history: Chronic hepatitis C, genotype 1A, cured with harvoni therapy. Hypertension. Hypercholesterolemia. Allergic rhinitis. Bicuspid aortic valve. Bovine aortic valve replacement surgery performed in 2014. Left orchiectomy performed to treat undescended testicle. Sinus surgery.  Exam: The patient is alert and lying comfortably on the endoscopy stretcher. Lungs are clear to auscultation. Cardiac exam reveals a regular rhythm. Abdomen is soft and flat. There is no tenderness to deep palpation patient in all quadrants.  Plan: Proceed with surveillance colonoscopy and screen for microscopic colitis.

## 2017-01-04 NOTE — Anesthesia Procedure Notes (Signed)
Procedure Name: MAC Date/Time: 01/04/2017 7:58 AM Performed by: Dione Booze Pre-anesthesia Checklist: Emergency Drugs available, Patient identified, Suction available and Patient being monitored Patient Re-evaluated:Patient Re-evaluated prior to inductionOxygen Delivery Method: Simple face mask Placement Confirmation: positive ETCO2

## 2017-01-05 ENCOUNTER — Encounter (HOSPITAL_COMMUNITY): Payer: Self-pay | Admitting: Gastroenterology

## 2017-01-06 DIAGNOSIS — Z85828 Personal history of other malignant neoplasm of skin: Secondary | ICD-10-CM | POA: Diagnosis not present

## 2017-01-06 DIAGNOSIS — L400 Psoriasis vulgaris: Secondary | ICD-10-CM | POA: Diagnosis not present

## 2017-01-06 DIAGNOSIS — L570 Actinic keratosis: Secondary | ICD-10-CM | POA: Diagnosis not present

## 2017-01-06 DIAGNOSIS — D1800 Hemangioma unspecified site: Secondary | ICD-10-CM | POA: Diagnosis not present

## 2017-01-06 DIAGNOSIS — L820 Inflamed seborrheic keratosis: Secondary | ICD-10-CM | POA: Diagnosis not present

## 2017-01-06 DIAGNOSIS — B078 Other viral warts: Secondary | ICD-10-CM | POA: Diagnosis not present

## 2017-01-06 DIAGNOSIS — Z1283 Encounter for screening for malignant neoplasm of skin: Secondary | ICD-10-CM | POA: Diagnosis not present

## 2017-01-07 ENCOUNTER — Encounter (HOSPITAL_COMMUNITY): Payer: Self-pay | Admitting: Gastroenterology

## 2017-01-07 NOTE — Addendum Note (Signed)
Addendum  created 01/07/17 0930 by Effie Berkshire, MD   Sign clinical note, SmartForm saved

## 2017-01-15 ENCOUNTER — Other Ambulatory Visit: Payer: Self-pay | Admitting: Internal Medicine

## 2017-01-15 DIAGNOSIS — D72820 Lymphocytosis (symptomatic): Secondary | ICD-10-CM | POA: Diagnosis not present

## 2017-01-15 DIAGNOSIS — R7309 Other abnormal glucose: Secondary | ICD-10-CM | POA: Diagnosis not present

## 2017-01-15 DIAGNOSIS — Z125 Encounter for screening for malignant neoplasm of prostate: Secondary | ICD-10-CM | POA: Diagnosis not present

## 2017-01-15 DIAGNOSIS — L570 Actinic keratosis: Secondary | ICD-10-CM | POA: Diagnosis not present

## 2017-01-15 DIAGNOSIS — E559 Vitamin D deficiency, unspecified: Secondary | ICD-10-CM | POA: Diagnosis not present

## 2017-01-15 DIAGNOSIS — Z0000 Encounter for general adult medical examination without abnormal findings: Secondary | ICD-10-CM | POA: Diagnosis not present

## 2017-01-15 DIAGNOSIS — M7581 Other shoulder lesions, right shoulder: Secondary | ICD-10-CM | POA: Diagnosis not present

## 2017-01-15 DIAGNOSIS — I10 Essential (primary) hypertension: Secondary | ICD-10-CM | POA: Diagnosis not present

## 2017-01-15 DIAGNOSIS — I359 Nonrheumatic aortic valve disorder, unspecified: Secondary | ICD-10-CM | POA: Diagnosis not present

## 2017-01-15 DIAGNOSIS — E291 Testicular hypofunction: Secondary | ICD-10-CM | POA: Diagnosis not present

## 2017-01-15 DIAGNOSIS — Z79899 Other long term (current) drug therapy: Secondary | ICD-10-CM | POA: Diagnosis not present

## 2017-01-15 DIAGNOSIS — E782 Mixed hyperlipidemia: Secondary | ICD-10-CM | POA: Diagnosis not present

## 2017-03-01 DIAGNOSIS — I10 Essential (primary) hypertension: Secondary | ICD-10-CM | POA: Diagnosis not present

## 2017-03-01 DIAGNOSIS — I359 Nonrheumatic aortic valve disorder, unspecified: Secondary | ICD-10-CM | POA: Diagnosis not present

## 2017-03-01 DIAGNOSIS — I5032 Chronic diastolic (congestive) heart failure: Secondary | ICD-10-CM | POA: Diagnosis not present

## 2017-03-01 DIAGNOSIS — I712 Thoracic aortic aneurysm, without rupture: Secondary | ICD-10-CM | POA: Diagnosis not present

## 2017-04-13 DIAGNOSIS — H9319 Tinnitus, unspecified ear: Secondary | ICD-10-CM | POA: Diagnosis not present

## 2017-04-13 DIAGNOSIS — L728 Other follicular cysts of the skin and subcutaneous tissue: Secondary | ICD-10-CM | POA: Diagnosis not present

## 2017-04-13 DIAGNOSIS — H903 Sensorineural hearing loss, bilateral: Secondary | ICD-10-CM | POA: Diagnosis not present

## 2017-04-13 DIAGNOSIS — H6123 Impacted cerumen, bilateral: Secondary | ICD-10-CM | POA: Diagnosis not present

## 2017-04-13 DIAGNOSIS — R42 Dizziness and giddiness: Secondary | ICD-10-CM | POA: Diagnosis not present

## 2017-04-16 NOTE — Addendum Note (Signed)
Addendum  created 04/16/17 1022 by Effie Berkshire, MD   Sign clinical note

## 2017-04-28 DIAGNOSIS — R208 Other disturbances of skin sensation: Secondary | ICD-10-CM | POA: Diagnosis not present

## 2017-04-28 DIAGNOSIS — L720 Epidermal cyst: Secondary | ICD-10-CM | POA: Diagnosis not present

## 2017-04-28 DIAGNOSIS — R234 Changes in skin texture: Secondary | ICD-10-CM | POA: Diagnosis not present

## 2017-06-15 DIAGNOSIS — L720 Epidermal cyst: Secondary | ICD-10-CM | POA: Diagnosis not present

## 2017-06-15 DIAGNOSIS — D179 Benign lipomatous neoplasm, unspecified: Secondary | ICD-10-CM | POA: Diagnosis not present

## 2017-06-22 DIAGNOSIS — L720 Epidermal cyst: Secondary | ICD-10-CM | POA: Diagnosis not present

## 2017-07-21 DIAGNOSIS — M7580 Other shoulder lesions, unspecified shoulder: Secondary | ICD-10-CM | POA: Diagnosis not present

## 2017-07-21 DIAGNOSIS — I10 Essential (primary) hypertension: Secondary | ICD-10-CM | POA: Diagnosis not present

## 2017-07-21 DIAGNOSIS — M5126 Other intervertebral disc displacement, lumbar region: Secondary | ICD-10-CM | POA: Diagnosis not present

## 2017-07-21 DIAGNOSIS — R739 Hyperglycemia, unspecified: Secondary | ICD-10-CM | POA: Diagnosis not present

## 2017-07-21 DIAGNOSIS — B1920 Unspecified viral hepatitis C without hepatic coma: Secondary | ICD-10-CM | POA: Diagnosis not present

## 2017-07-21 DIAGNOSIS — K589 Irritable bowel syndrome without diarrhea: Secondary | ICD-10-CM | POA: Diagnosis not present

## 2017-08-23 DIAGNOSIS — Q231 Congenital insufficiency of aortic valve: Secondary | ICD-10-CM | POA: Diagnosis not present

## 2017-08-30 DIAGNOSIS — S46911D Strain of unspecified muscle, fascia and tendon at shoulder and upper arm level, right arm, subsequent encounter: Secondary | ICD-10-CM | POA: Diagnosis not present

## 2017-08-30 DIAGNOSIS — M7581 Other shoulder lesions, right shoulder: Secondary | ICD-10-CM | POA: Diagnosis not present

## 2017-08-30 DIAGNOSIS — I359 Nonrheumatic aortic valve disorder, unspecified: Secondary | ICD-10-CM | POA: Diagnosis not present

## 2017-08-30 DIAGNOSIS — I712 Thoracic aortic aneurysm, without rupture: Secondary | ICD-10-CM | POA: Diagnosis not present

## 2017-08-30 DIAGNOSIS — I10 Essential (primary) hypertension: Secondary | ICD-10-CM | POA: Diagnosis not present

## 2017-08-30 DIAGNOSIS — Q231 Congenital insufficiency of aortic valve: Secondary | ICD-10-CM | POA: Diagnosis not present

## 2017-09-10 DIAGNOSIS — S46911D Strain of unspecified muscle, fascia and tendon at shoulder and upper arm level, right arm, subsequent encounter: Secondary | ICD-10-CM | POA: Diagnosis not present

## 2017-09-10 DIAGNOSIS — Z23 Encounter for immunization: Secondary | ICD-10-CM | POA: Diagnosis not present

## 2017-09-10 DIAGNOSIS — M7581 Other shoulder lesions, right shoulder: Secondary | ICD-10-CM | POA: Diagnosis not present

## 2017-09-16 DIAGNOSIS — B1920 Unspecified viral hepatitis C without hepatic coma: Secondary | ICD-10-CM | POA: Diagnosis not present

## 2017-09-16 DIAGNOSIS — K219 Gastro-esophageal reflux disease without esophagitis: Secondary | ICD-10-CM | POA: Diagnosis not present

## 2017-09-16 DIAGNOSIS — R1011 Right upper quadrant pain: Secondary | ICD-10-CM | POA: Diagnosis not present

## 2017-10-14 DIAGNOSIS — L720 Epidermal cyst: Secondary | ICD-10-CM | POA: Diagnosis not present

## 2017-10-14 DIAGNOSIS — D2372 Other benign neoplasm of skin of left lower limb, including hip: Secondary | ICD-10-CM | POA: Diagnosis not present

## 2017-10-14 DIAGNOSIS — I788 Other diseases of capillaries: Secondary | ICD-10-CM | POA: Diagnosis not present

## 2017-10-14 DIAGNOSIS — L700 Acne vulgaris: Secondary | ICD-10-CM | POA: Diagnosis not present

## 2017-10-21 DIAGNOSIS — K219 Gastro-esophageal reflux disease without esophagitis: Secondary | ICD-10-CM | POA: Diagnosis not present

## 2017-10-21 DIAGNOSIS — K2970 Gastritis, unspecified, without bleeding: Secondary | ICD-10-CM | POA: Diagnosis not present

## 2017-10-21 DIAGNOSIS — K3189 Other diseases of stomach and duodenum: Secondary | ICD-10-CM | POA: Diagnosis not present

## 2017-10-21 DIAGNOSIS — R1011 Right upper quadrant pain: Secondary | ICD-10-CM | POA: Diagnosis not present

## 2017-12-20 DIAGNOSIS — R1011 Right upper quadrant pain: Secondary | ICD-10-CM | POA: Diagnosis not present

## 2017-12-20 DIAGNOSIS — K2970 Gastritis, unspecified, without bleeding: Secondary | ICD-10-CM | POA: Diagnosis not present

## 2017-12-20 DIAGNOSIS — K589 Irritable bowel syndrome without diarrhea: Secondary | ICD-10-CM | POA: Diagnosis not present

## 2018-01-05 DIAGNOSIS — Z85828 Personal history of other malignant neoplasm of skin: Secondary | ICD-10-CM | POA: Diagnosis not present

## 2018-01-05 DIAGNOSIS — L820 Inflamed seborrheic keratosis: Secondary | ICD-10-CM | POA: Diagnosis not present

## 2018-01-05 DIAGNOSIS — D1800 Hemangioma unspecified site: Secondary | ICD-10-CM | POA: Diagnosis not present

## 2018-01-05 DIAGNOSIS — D485 Neoplasm of uncertain behavior of skin: Secondary | ICD-10-CM | POA: Diagnosis not present

## 2018-01-05 DIAGNOSIS — D235 Other benign neoplasm of skin of trunk: Secondary | ICD-10-CM | POA: Diagnosis not present

## 2018-01-05 DIAGNOSIS — D229 Melanocytic nevi, unspecified: Secondary | ICD-10-CM | POA: Diagnosis not present

## 2018-01-05 DIAGNOSIS — Z1283 Encounter for screening for malignant neoplasm of skin: Secondary | ICD-10-CM | POA: Diagnosis not present

## 2018-01-17 DIAGNOSIS — Z0001 Encounter for general adult medical examination with abnormal findings: Secondary | ICD-10-CM | POA: Diagnosis not present

## 2018-01-17 DIAGNOSIS — Z79899 Other long term (current) drug therapy: Secondary | ICD-10-CM | POA: Diagnosis not present

## 2018-01-17 DIAGNOSIS — E559 Vitamin D deficiency, unspecified: Secondary | ICD-10-CM | POA: Diagnosis not present

## 2018-01-17 DIAGNOSIS — E782 Mixed hyperlipidemia: Secondary | ICD-10-CM | POA: Diagnosis not present

## 2018-01-17 DIAGNOSIS — R739 Hyperglycemia, unspecified: Secondary | ICD-10-CM | POA: Diagnosis not present

## 2018-01-17 DIAGNOSIS — M7580 Other shoulder lesions, unspecified shoulder: Secondary | ICD-10-CM | POA: Diagnosis not present

## 2018-01-17 DIAGNOSIS — Z125 Encounter for screening for malignant neoplasm of prostate: Secondary | ICD-10-CM | POA: Diagnosis not present

## 2018-01-17 DIAGNOSIS — E291 Testicular hypofunction: Secondary | ICD-10-CM | POA: Diagnosis not present

## 2018-01-17 DIAGNOSIS — M549 Dorsalgia, unspecified: Secondary | ICD-10-CM | POA: Diagnosis not present

## 2018-03-11 DIAGNOSIS — Q231 Congenital insufficiency of aortic valve: Secondary | ICD-10-CM | POA: Diagnosis not present

## 2018-03-11 DIAGNOSIS — I10 Essential (primary) hypertension: Secondary | ICD-10-CM | POA: Diagnosis not present

## 2018-03-11 DIAGNOSIS — I359 Nonrheumatic aortic valve disorder, unspecified: Secondary | ICD-10-CM | POA: Diagnosis not present

## 2018-03-11 DIAGNOSIS — I5032 Chronic diastolic (congestive) heart failure: Secondary | ICD-10-CM | POA: Diagnosis not present

## 2018-04-15 DIAGNOSIS — Z95828 Presence of other vascular implants and grafts: Secondary | ICD-10-CM | POA: Diagnosis not present

## 2018-04-15 DIAGNOSIS — Z9889 Other specified postprocedural states: Secondary | ICD-10-CM | POA: Diagnosis not present

## 2018-04-15 DIAGNOSIS — Z8774 Personal history of (corrected) congenital malformations of heart and circulatory system: Secondary | ICD-10-CM | POA: Diagnosis not present

## 2018-04-15 DIAGNOSIS — Z8679 Personal history of other diseases of the circulatory system: Secondary | ICD-10-CM | POA: Diagnosis not present

## 2018-04-15 DIAGNOSIS — Q231 Congenital insufficiency of aortic valve: Secondary | ICD-10-CM | POA: Diagnosis not present

## 2018-04-15 DIAGNOSIS — I712 Thoracic aortic aneurysm, without rupture: Secondary | ICD-10-CM | POA: Diagnosis not present

## 2018-04-15 DIAGNOSIS — I359 Nonrheumatic aortic valve disorder, unspecified: Secondary | ICD-10-CM | POA: Diagnosis not present

## 2018-06-07 ENCOUNTER — Other Ambulatory Visit (HOSPITAL_COMMUNITY): Payer: Self-pay | Admitting: Internal Medicine

## 2018-06-07 ENCOUNTER — Other Ambulatory Visit: Payer: Self-pay | Admitting: Internal Medicine

## 2018-06-07 DIAGNOSIS — R1011 Right upper quadrant pain: Secondary | ICD-10-CM

## 2018-06-07 DIAGNOSIS — R911 Solitary pulmonary nodule: Secondary | ICD-10-CM | POA: Diagnosis not present

## 2018-06-14 ENCOUNTER — Encounter
Admission: RE | Admit: 2018-06-14 | Discharge: 2018-06-14 | Disposition: A | Discharge status: Home / Self Care | Payer: BLUE CROSS/BLUE SHIELD | Source: Ambulatory Visit | Attending: Internal Medicine | Admitting: Internal Medicine

## 2018-06-14 DIAGNOSIS — R1011 Right upper quadrant pain: Secondary | ICD-10-CM | POA: Diagnosis not present

## 2018-06-14 MED ORDER — TECHNETIUM TC 99M MEBROFENIN IV KIT
5.6410 | PACK | Freq: Once | INTRAVENOUS | Status: AC | PRN
  Administered 2018-06-14: 5.641 via INTRAVENOUS

## 2018-06-29 ENCOUNTER — Other Ambulatory Visit: Payer: Self-pay | Admitting: Physician Assistant

## 2018-06-29 DIAGNOSIS — R1011 Right upper quadrant pain: Secondary | ICD-10-CM | POA: Diagnosis not present

## 2018-06-29 DIAGNOSIS — K589 Irritable bowel syndrome without diarrhea: Secondary | ICD-10-CM | POA: Diagnosis not present

## 2018-07-06 ENCOUNTER — Ambulatory Visit
Admission: RE | Admit: 2018-07-06 | Discharge: 2018-07-06 | Disposition: A | Discharge status: Home / Self Care | Payer: BLUE CROSS/BLUE SHIELD | Source: Ambulatory Visit | Attending: Physician Assistant | Admitting: Physician Assistant

## 2018-07-06 DIAGNOSIS — R1011 Right upper quadrant pain: Secondary | ICD-10-CM

## 2018-07-06 DIAGNOSIS — N281 Cyst of kidney, acquired: Secondary | ICD-10-CM | POA: Diagnosis not present

## 2018-07-21 DIAGNOSIS — Z720 Tobacco use: Secondary | ICD-10-CM | POA: Diagnosis not present

## 2018-07-21 DIAGNOSIS — D72820 Lymphocytosis (symptomatic): Secondary | ICD-10-CM | POA: Diagnosis not present

## 2018-07-21 DIAGNOSIS — Z79899 Other long term (current) drug therapy: Secondary | ICD-10-CM | POA: Diagnosis not present

## 2018-07-21 DIAGNOSIS — R1011 Right upper quadrant pain: Secondary | ICD-10-CM | POA: Diagnosis not present

## 2018-07-21 DIAGNOSIS — Z23 Encounter for immunization: Secondary | ICD-10-CM | POA: Diagnosis not present

## 2018-07-21 DIAGNOSIS — I10 Essential (primary) hypertension: Secondary | ICD-10-CM | POA: Diagnosis not present

## 2018-09-16 DIAGNOSIS — I359 Nonrheumatic aortic valve disorder, unspecified: Secondary | ICD-10-CM | POA: Diagnosis not present

## 2018-09-16 DIAGNOSIS — Q231 Congenital insufficiency of aortic valve: Secondary | ICD-10-CM | POA: Diagnosis not present

## 2018-09-16 DIAGNOSIS — I10 Essential (primary) hypertension: Secondary | ICD-10-CM | POA: Diagnosis not present

## 2018-09-16 DIAGNOSIS — I712 Thoracic aortic aneurysm, without rupture: Secondary | ICD-10-CM | POA: Diagnosis not present

## 2018-10-05 DIAGNOSIS — Q231 Congenital insufficiency of aortic valve: Secondary | ICD-10-CM | POA: Diagnosis not present

## 2018-10-26 DIAGNOSIS — Z23 Encounter for immunization: Secondary | ICD-10-CM | POA: Diagnosis not present

## 2018-10-26 DIAGNOSIS — G629 Polyneuropathy, unspecified: Secondary | ICD-10-CM | POA: Diagnosis not present

## 2018-11-24 DIAGNOSIS — D2372 Other benign neoplasm of skin of left lower limb, including hip: Secondary | ICD-10-CM | POA: Diagnosis not present

## 2018-11-24 DIAGNOSIS — L0100 Impetigo, unspecified: Secondary | ICD-10-CM | POA: Diagnosis not present

## 2018-11-24 DIAGNOSIS — L821 Other seborrheic keratosis: Secondary | ICD-10-CM | POA: Diagnosis not present

## 2018-12-16 DIAGNOSIS — H5789 Other specified disorders of eye and adnexa: Secondary | ICD-10-CM | POA: Diagnosis not present

## 2018-12-29 DIAGNOSIS — G629 Polyneuropathy, unspecified: Secondary | ICD-10-CM | POA: Diagnosis not present

## 2018-12-29 DIAGNOSIS — L0100 Impetigo, unspecified: Secondary | ICD-10-CM | POA: Diagnosis not present

## 2018-12-30 DIAGNOSIS — H43811 Vitreous degeneration, right eye: Secondary | ICD-10-CM | POA: Diagnosis not present

## 2019-01-09 DIAGNOSIS — Z1283 Encounter for screening for malignant neoplasm of skin: Secondary | ICD-10-CM | POA: Diagnosis not present

## 2019-01-09 DIAGNOSIS — Z85828 Personal history of other malignant neoplasm of skin: Secondary | ICD-10-CM | POA: Diagnosis not present

## 2019-01-09 DIAGNOSIS — D2230 Melanocytic nevi of unspecified part of face: Secondary | ICD-10-CM | POA: Diagnosis not present

## 2019-01-09 DIAGNOSIS — D225 Melanocytic nevi of trunk: Secondary | ICD-10-CM | POA: Diagnosis not present

## 2019-01-09 DIAGNOSIS — L820 Inflamed seborrheic keratosis: Secondary | ICD-10-CM | POA: Diagnosis not present

## 2019-01-18 DIAGNOSIS — H43813 Vitreous degeneration, bilateral: Secondary | ICD-10-CM | POA: Diagnosis not present

## 2019-01-30 ENCOUNTER — Other Ambulatory Visit: Payer: Self-pay | Admitting: Internal Medicine

## 2019-01-30 ENCOUNTER — Ambulatory Visit
Admission: RE | Admit: 2019-01-30 | Discharge: 2019-01-30 | Disposition: A | Discharge status: Home / Self Care | Payer: BLUE CROSS/BLUE SHIELD | Source: Ambulatory Visit | Attending: Internal Medicine | Admitting: Internal Medicine

## 2019-01-30 DIAGNOSIS — I10 Essential (primary) hypertension: Secondary | ICD-10-CM | POA: Diagnosis not present

## 2019-01-30 DIAGNOSIS — Z125 Encounter for screening for malignant neoplasm of prostate: Secondary | ICD-10-CM | POA: Diagnosis not present

## 2019-01-30 DIAGNOSIS — Z23 Encounter for immunization: Secondary | ICD-10-CM | POA: Diagnosis not present

## 2019-01-30 DIAGNOSIS — Z0000 Encounter for general adult medical examination without abnormal findings: Secondary | ICD-10-CM | POA: Diagnosis not present

## 2019-01-30 DIAGNOSIS — E782 Mixed hyperlipidemia: Secondary | ICD-10-CM | POA: Diagnosis not present

## 2019-01-30 DIAGNOSIS — Z0001 Encounter for general adult medical examination with abnormal findings: Secondary | ICD-10-CM | POA: Diagnosis not present

## 2019-01-30 DIAGNOSIS — R911 Solitary pulmonary nodule: Secondary | ICD-10-CM | POA: Diagnosis not present

## 2019-01-30 DIAGNOSIS — G629 Polyneuropathy, unspecified: Secondary | ICD-10-CM | POA: Diagnosis not present

## 2019-01-30 DIAGNOSIS — R739 Hyperglycemia, unspecified: Secondary | ICD-10-CM | POA: Diagnosis not present

## 2019-01-30 DIAGNOSIS — E291 Testicular hypofunction: Secondary | ICD-10-CM | POA: Diagnosis not present

## 2019-01-30 DIAGNOSIS — Z79899 Other long term (current) drug therapy: Secondary | ICD-10-CM | POA: Diagnosis not present

## 2019-03-20 DIAGNOSIS — Q231 Congenital insufficiency of aortic valve: Secondary | ICD-10-CM | POA: Diagnosis not present

## 2019-03-20 DIAGNOSIS — I712 Thoracic aortic aneurysm, without rupture: Secondary | ICD-10-CM | POA: Diagnosis not present

## 2019-03-20 DIAGNOSIS — I5032 Chronic diastolic (congestive) heart failure: Secondary | ICD-10-CM | POA: Diagnosis not present

## 2019-03-20 DIAGNOSIS — R0602 Shortness of breath: Secondary | ICD-10-CM | POA: Diagnosis not present

## 2019-03-27 DIAGNOSIS — Z1159 Encounter for screening for other viral diseases: Secondary | ICD-10-CM | POA: Diagnosis not present

## 2019-03-30 DIAGNOSIS — R0602 Shortness of breath: Secondary | ICD-10-CM | POA: Diagnosis not present

## 2019-04-18 ENCOUNTER — Other Ambulatory Visit: Payer: Self-pay | Admitting: Cardiology

## 2019-04-18 DIAGNOSIS — I712 Thoracic aortic aneurysm, without rupture, unspecified: Secondary | ICD-10-CM

## 2019-04-18 DIAGNOSIS — Q231 Congenital insufficiency of aortic valve: Secondary | ICD-10-CM | POA: Diagnosis not present

## 2019-04-18 DIAGNOSIS — I359 Nonrheumatic aortic valve disorder, unspecified: Secondary | ICD-10-CM | POA: Diagnosis not present

## 2019-04-18 DIAGNOSIS — I10 Essential (primary) hypertension: Secondary | ICD-10-CM | POA: Diagnosis not present

## 2019-04-20 ENCOUNTER — Other Ambulatory Visit: Payer: Self-pay | Admitting: Internal Medicine

## 2019-04-20 DIAGNOSIS — Z86010 Personal history of colonic polyps: Secondary | ICD-10-CM | POA: Diagnosis not present

## 2019-04-20 DIAGNOSIS — R140 Abdominal distension (gaseous): Secondary | ICD-10-CM

## 2019-04-20 DIAGNOSIS — I359 Nonrheumatic aortic valve disorder, unspecified: Secondary | ICD-10-CM | POA: Diagnosis not present

## 2019-04-20 DIAGNOSIS — I5032 Chronic diastolic (congestive) heart failure: Secondary | ICD-10-CM | POA: Diagnosis not present

## 2019-04-20 DIAGNOSIS — I712 Thoracic aortic aneurysm, without rupture: Secondary | ICD-10-CM | POA: Diagnosis not present

## 2019-04-21 DIAGNOSIS — J449 Chronic obstructive pulmonary disease, unspecified: Secondary | ICD-10-CM | POA: Diagnosis not present

## 2019-04-21 DIAGNOSIS — F17218 Nicotine dependence, cigarettes, with other nicotine-induced disorders: Secondary | ICD-10-CM | POA: Diagnosis not present

## 2019-04-27 ENCOUNTER — Other Ambulatory Visit: Payer: Self-pay

## 2019-04-27 ENCOUNTER — Ambulatory Visit
Admission: RE | Admit: 2019-04-27 | Discharge: 2019-04-27 | Disposition: A | Discharge status: Home / Self Care | Payer: BC Managed Care – PPO | Source: Ambulatory Visit | Attending: Cardiology | Admitting: Cardiology

## 2019-04-27 DIAGNOSIS — I712 Thoracic aortic aneurysm, without rupture, unspecified: Secondary | ICD-10-CM

## 2019-04-27 DIAGNOSIS — R140 Abdominal distension (gaseous): Secondary | ICD-10-CM | POA: Diagnosis not present

## 2019-04-27 DIAGNOSIS — R1011 Right upper quadrant pain: Secondary | ICD-10-CM | POA: Diagnosis not present

## 2019-04-27 DIAGNOSIS — R0602 Shortness of breath: Secondary | ICD-10-CM | POA: Diagnosis not present

## 2019-04-27 LAB — POCT I-STAT CREATININE: Creatinine, Ser: 0.7 mg/dL (ref 0.61–1.24)

## 2019-04-27 MED ORDER — IOPAMIDOL (ISOVUE-370) INJECTION 76%
80.0000 mL | Freq: Once | INTRAVENOUS | Status: AC | PRN
  Administered 2019-04-27: 80 mL via INTRAVENOUS

## 2019-05-02 ENCOUNTER — Ambulatory Visit: Payer: BC Managed Care – PPO

## 2019-05-16 ENCOUNTER — Ambulatory Visit
Admission: RE | Admit: 2019-05-16 | Discharge: 2019-05-16 | Disposition: A | Discharge status: Home / Self Care | Payer: BC Managed Care – PPO | Source: Ambulatory Visit | Attending: Internal Medicine | Admitting: Internal Medicine

## 2019-05-16 ENCOUNTER — Other Ambulatory Visit: Payer: Self-pay | Admitting: Internal Medicine

## 2019-05-16 DIAGNOSIS — R911 Solitary pulmonary nodule: Secondary | ICD-10-CM | POA: Diagnosis not present

## 2019-05-16 DIAGNOSIS — R0789 Other chest pain: Secondary | ICD-10-CM | POA: Diagnosis not present

## 2019-05-16 DIAGNOSIS — R072 Precordial pain: Secondary | ICD-10-CM | POA: Diagnosis not present

## 2019-05-16 DIAGNOSIS — Z720 Tobacco use: Secondary | ICD-10-CM | POA: Diagnosis not present

## 2019-05-16 DIAGNOSIS — I10 Essential (primary) hypertension: Secondary | ICD-10-CM | POA: Diagnosis not present

## 2019-05-26 DIAGNOSIS — K219 Gastro-esophageal reflux disease without esophagitis: Secondary | ICD-10-CM | POA: Diagnosis not present

## 2019-05-26 DIAGNOSIS — H6121 Impacted cerumen, right ear: Secondary | ICD-10-CM | POA: Diagnosis not present

## 2019-05-26 DIAGNOSIS — R490 Dysphonia: Secondary | ICD-10-CM | POA: Diagnosis not present

## 2019-06-09 DIAGNOSIS — K30 Functional dyspepsia: Secondary | ICD-10-CM | POA: Diagnosis not present

## 2019-06-09 DIAGNOSIS — K580 Irritable bowel syndrome with diarrhea: Secondary | ICD-10-CM | POA: Diagnosis not present

## 2019-06-09 DIAGNOSIS — K2970 Gastritis, unspecified, without bleeding: Secondary | ICD-10-CM | POA: Diagnosis not present

## 2019-06-09 DIAGNOSIS — I5032 Chronic diastolic (congestive) heart failure: Secondary | ICD-10-CM | POA: Diagnosis not present

## 2019-06-14 DIAGNOSIS — Z9889 Other specified postprocedural states: Secondary | ICD-10-CM | POA: Diagnosis not present

## 2019-06-14 DIAGNOSIS — Z8679 Personal history of other diseases of the circulatory system: Secondary | ICD-10-CM | POA: Diagnosis not present

## 2019-06-21 DIAGNOSIS — J449 Chronic obstructive pulmonary disease, unspecified: Secondary | ICD-10-CM | POA: Diagnosis not present

## 2019-06-28 DIAGNOSIS — M5136 Other intervertebral disc degeneration, lumbar region: Secondary | ICD-10-CM | POA: Diagnosis not present

## 2019-06-28 DIAGNOSIS — M5416 Radiculopathy, lumbar region: Secondary | ICD-10-CM | POA: Diagnosis not present

## 2019-06-28 DIAGNOSIS — M48062 Spinal stenosis, lumbar region with neurogenic claudication: Secondary | ICD-10-CM | POA: Diagnosis not present

## 2019-06-29 DIAGNOSIS — Q231 Congenital insufficiency of aortic valve: Secondary | ICD-10-CM | POA: Diagnosis not present

## 2019-06-29 DIAGNOSIS — I10 Essential (primary) hypertension: Secondary | ICD-10-CM | POA: Diagnosis not present

## 2019-06-29 DIAGNOSIS — I712 Thoracic aortic aneurysm, without rupture: Secondary | ICD-10-CM | POA: Diagnosis not present

## 2019-06-29 DIAGNOSIS — I359 Nonrheumatic aortic valve disorder, unspecified: Secondary | ICD-10-CM | POA: Diagnosis not present

## 2019-07-17 DIAGNOSIS — M48062 Spinal stenosis, lumbar region with neurogenic claudication: Secondary | ICD-10-CM | POA: Diagnosis not present

## 2019-07-17 DIAGNOSIS — M5136 Other intervertebral disc degeneration, lumbar region: Secondary | ICD-10-CM | POA: Diagnosis not present

## 2019-07-17 DIAGNOSIS — M5441 Lumbago with sciatica, right side: Secondary | ICD-10-CM | POA: Diagnosis not present

## 2019-07-17 DIAGNOSIS — M5416 Radiculopathy, lumbar region: Secondary | ICD-10-CM | POA: Diagnosis not present

## 2019-07-19 ENCOUNTER — Other Ambulatory Visit: Payer: Self-pay | Admitting: Physical Medicine and Rehabilitation

## 2019-07-19 DIAGNOSIS — M5136 Other intervertebral disc degeneration, lumbar region: Secondary | ICD-10-CM

## 2019-07-19 DIAGNOSIS — M5441 Lumbago with sciatica, right side: Secondary | ICD-10-CM

## 2019-07-25 ENCOUNTER — Other Ambulatory Visit: Payer: Self-pay

## 2019-07-25 ENCOUNTER — Ambulatory Visit
Admission: RE | Admit: 2019-07-25 | Discharge: 2019-07-25 | Disposition: A | Discharge status: Home / Self Care | Payer: BC Managed Care – PPO | Source: Ambulatory Visit | Attending: Physical Medicine and Rehabilitation | Admitting: Physical Medicine and Rehabilitation

## 2019-07-25 DIAGNOSIS — M5127 Other intervertebral disc displacement, lumbosacral region: Secondary | ICD-10-CM | POA: Diagnosis not present

## 2019-07-25 DIAGNOSIS — M5136 Other intervertebral disc degeneration, lumbar region: Secondary | ICD-10-CM

## 2019-07-25 DIAGNOSIS — M5441 Lumbago with sciatica, right side: Secondary | ICD-10-CM

## 2019-07-25 DIAGNOSIS — M4807 Spinal stenosis, lumbosacral region: Secondary | ICD-10-CM | POA: Diagnosis not present

## 2019-07-25 DIAGNOSIS — M48061 Spinal stenosis, lumbar region without neurogenic claudication: Secondary | ICD-10-CM

## 2019-07-25 DIAGNOSIS — M5137 Other intervertebral disc degeneration, lumbosacral region: Secondary | ICD-10-CM | POA: Diagnosis not present

## 2019-07-25 HISTORY — DX: Spinal stenosis, lumbar region without neurogenic claudication: M48.061

## 2019-07-31 DIAGNOSIS — M5416 Radiculopathy, lumbar region: Secondary | ICD-10-CM | POA: Diagnosis not present

## 2019-07-31 DIAGNOSIS — M4807 Spinal stenosis, lumbosacral region: Secondary | ICD-10-CM | POA: Diagnosis not present

## 2019-08-18 DIAGNOSIS — M62838 Other muscle spasm: Secondary | ICD-10-CM | POA: Diagnosis not present

## 2019-08-18 DIAGNOSIS — M5136 Other intervertebral disc degeneration, lumbar region: Secondary | ICD-10-CM | POA: Diagnosis not present

## 2019-08-18 DIAGNOSIS — M5416 Radiculopathy, lumbar region: Secondary | ICD-10-CM | POA: Diagnosis not present

## 2019-08-18 DIAGNOSIS — M48062 Spinal stenosis, lumbar region with neurogenic claudication: Secondary | ICD-10-CM | POA: Diagnosis not present

## 2019-08-21 DIAGNOSIS — I10 Essential (primary) hypertension: Secondary | ICD-10-CM | POA: Diagnosis not present

## 2019-08-21 DIAGNOSIS — M5416 Radiculopathy, lumbar region: Secondary | ICD-10-CM | POA: Diagnosis not present

## 2019-08-21 DIAGNOSIS — Z23 Encounter for immunization: Secondary | ICD-10-CM | POA: Diagnosis not present

## 2019-08-21 DIAGNOSIS — E782 Mixed hyperlipidemia: Secondary | ICD-10-CM | POA: Diagnosis not present

## 2019-08-21 DIAGNOSIS — E559 Vitamin D deficiency, unspecified: Secondary | ICD-10-CM | POA: Diagnosis not present

## 2019-08-21 DIAGNOSIS — R739 Hyperglycemia, unspecified: Secondary | ICD-10-CM | POA: Diagnosis not present

## 2019-08-21 DIAGNOSIS — M4807 Spinal stenosis, lumbosacral region: Secondary | ICD-10-CM | POA: Diagnosis not present

## 2019-08-21 DIAGNOSIS — Z720 Tobacco use: Secondary | ICD-10-CM | POA: Diagnosis not present

## 2019-08-21 DIAGNOSIS — E291 Testicular hypofunction: Secondary | ICD-10-CM | POA: Diagnosis not present

## 2019-08-22 DIAGNOSIS — M48061 Spinal stenosis, lumbar region without neurogenic claudication: Secondary | ICD-10-CM | POA: Diagnosis not present

## 2019-09-18 DIAGNOSIS — M48062 Spinal stenosis, lumbar region with neurogenic claudication: Secondary | ICD-10-CM | POA: Diagnosis not present

## 2019-09-18 DIAGNOSIS — M62838 Other muscle spasm: Secondary | ICD-10-CM | POA: Diagnosis not present

## 2019-09-18 DIAGNOSIS — M5416 Radiculopathy, lumbar region: Secondary | ICD-10-CM | POA: Diagnosis not present

## 2019-09-18 DIAGNOSIS — M5136 Other intervertebral disc degeneration, lumbar region: Secondary | ICD-10-CM | POA: Diagnosis not present

## 2019-09-25 DIAGNOSIS — Q231 Congenital insufficiency of aortic valve: Secondary | ICD-10-CM | POA: Diagnosis not present

## 2019-09-25 DIAGNOSIS — M542 Cervicalgia: Secondary | ICD-10-CM | POA: Diagnosis not present

## 2019-10-04 DIAGNOSIS — M542 Cervicalgia: Secondary | ICD-10-CM | POA: Diagnosis not present

## 2019-10-06 DIAGNOSIS — M542 Cervicalgia: Secondary | ICD-10-CM | POA: Diagnosis not present

## 2019-10-11 DIAGNOSIS — M542 Cervicalgia: Secondary | ICD-10-CM | POA: Diagnosis not present

## 2019-10-17 DIAGNOSIS — M542 Cervicalgia: Secondary | ICD-10-CM | POA: Diagnosis not present

## 2019-10-19 DIAGNOSIS — M542 Cervicalgia: Secondary | ICD-10-CM | POA: Diagnosis not present

## 2019-10-23 DIAGNOSIS — M542 Cervicalgia: Secondary | ICD-10-CM | POA: Diagnosis not present

## 2019-10-27 DIAGNOSIS — M542 Cervicalgia: Secondary | ICD-10-CM | POA: Diagnosis not present

## 2019-10-30 DIAGNOSIS — M542 Cervicalgia: Secondary | ICD-10-CM | POA: Diagnosis not present

## 2019-11-03 DIAGNOSIS — M542 Cervicalgia: Secondary | ICD-10-CM | POA: Diagnosis not present

## 2019-11-07 DIAGNOSIS — M542 Cervicalgia: Secondary | ICD-10-CM | POA: Diagnosis not present

## 2019-11-14 DIAGNOSIS — M542 Cervicalgia: Secondary | ICD-10-CM | POA: Diagnosis not present

## 2019-12-04 DIAGNOSIS — M48062 Spinal stenosis, lumbar region with neurogenic claudication: Secondary | ICD-10-CM | POA: Diagnosis not present

## 2019-12-04 DIAGNOSIS — M5416 Radiculopathy, lumbar region: Secondary | ICD-10-CM | POA: Diagnosis not present

## 2019-12-04 DIAGNOSIS — M62838 Other muscle spasm: Secondary | ICD-10-CM | POA: Diagnosis not present

## 2019-12-04 DIAGNOSIS — M5136 Other intervertebral disc degeneration, lumbar region: Secondary | ICD-10-CM | POA: Diagnosis not present

## 2019-12-06 DIAGNOSIS — M5136 Other intervertebral disc degeneration, lumbar region: Secondary | ICD-10-CM | POA: Diagnosis not present

## 2019-12-06 DIAGNOSIS — M62838 Other muscle spasm: Secondary | ICD-10-CM | POA: Diagnosis not present

## 2019-12-18 DIAGNOSIS — Z1283 Encounter for screening for malignant neoplasm of skin: Secondary | ICD-10-CM | POA: Diagnosis not present

## 2019-12-18 DIAGNOSIS — L0291 Cutaneous abscess, unspecified: Secondary | ICD-10-CM | POA: Diagnosis not present

## 2019-12-18 DIAGNOSIS — L821 Other seborrheic keratosis: Secondary | ICD-10-CM | POA: Diagnosis not present

## 2019-12-18 DIAGNOSIS — L02818 Cutaneous abscess of other sites: Secondary | ICD-10-CM | POA: Diagnosis not present

## 2019-12-18 DIAGNOSIS — L720 Epidermal cyst: Secondary | ICD-10-CM | POA: Diagnosis not present

## 2019-12-18 DIAGNOSIS — D0472 Carcinoma in situ of skin of left lower limb, including hip: Secondary | ICD-10-CM | POA: Diagnosis not present

## 2019-12-18 DIAGNOSIS — D485 Neoplasm of uncertain behavior of skin: Secondary | ICD-10-CM | POA: Diagnosis not present

## 2020-01-08 DIAGNOSIS — L578 Other skin changes due to chronic exposure to nonionizing radiation: Secondary | ICD-10-CM | POA: Diagnosis not present

## 2020-01-08 DIAGNOSIS — Z872 Personal history of diseases of the skin and subcutaneous tissue: Secondary | ICD-10-CM | POA: Diagnosis not present

## 2020-01-08 DIAGNOSIS — D0472 Carcinoma in situ of skin of left lower limb, including hip: Secondary | ICD-10-CM | POA: Diagnosis not present

## 2020-01-09 DIAGNOSIS — I10 Essential (primary) hypertension: Secondary | ICD-10-CM | POA: Diagnosis not present

## 2020-01-09 DIAGNOSIS — Q231 Congenital insufficiency of aortic valve: Secondary | ICD-10-CM | POA: Diagnosis not present

## 2020-01-09 DIAGNOSIS — I359 Nonrheumatic aortic valve disorder, unspecified: Secondary | ICD-10-CM | POA: Diagnosis not present

## 2020-01-09 DIAGNOSIS — I712 Thoracic aortic aneurysm, without rupture: Secondary | ICD-10-CM | POA: Diagnosis not present

## 2020-01-18 DIAGNOSIS — H2513 Age-related nuclear cataract, bilateral: Secondary | ICD-10-CM | POA: Diagnosis not present

## 2020-02-06 ENCOUNTER — Telehealth: Payer: Self-pay

## 2020-02-06 NOTE — Telephone Encounter (Signed)
Pt called he is concerned that his L lat ankle where he a SCCis removed 12-18-19 is not healing, pt will email a photo here today, pt using otc hydrogen peroxide and Mupirocin ointment.  Discussed with pt stop otc peroxide, use only mild soap and water and apply the mupirocin ointment daily, he can make an appt if he would like to see Dr Raliegh Ip

## 2020-02-07 ENCOUNTER — Ambulatory Visit: Payer: BC Managed Care – PPO | Admitting: Dermatology

## 2020-02-07 ENCOUNTER — Other Ambulatory Visit: Payer: Self-pay

## 2020-02-07 DIAGNOSIS — L720 Epidermal cyst: Secondary | ICD-10-CM

## 2020-02-07 DIAGNOSIS — Z86007 Personal history of in-situ neoplasm of skin: Secondary | ICD-10-CM | POA: Diagnosis not present

## 2020-02-07 MED ORDER — MUPIROCIN 2 % EX OINT: 1.0000 "application " | TOPICAL_OINTMENT | Freq: Every day | CUTANEOUS | 4 refills | Status: DC

## 2020-02-07 NOTE — Progress Notes (Signed)
   Follow-Up Visit   Subjective  Matthew Kelly is a 64 y.o. male who presents for the following: check SCCIS edc site (L lat ankle edc on 01/08/20 mupirocin qd) and recheck cyst (R peri areolar area mupirocin ont qd).   The following portions of the chart were reviewed this encounter and updated as appropriate:     Review of Systems: No other skin or systemic complaints.  Objective  Well appearing patient in no apparent distress; mood and affect are within normal limits.  A focused examination was performed including L lat ankle, R chest. Relevant physical exam findings are noted in the Assessment and Plan.  Objective  R peri areolar area: Cystic papule with scarring - improved.  Objective  L lat ankle: Healing ulcer from [facility] on 01/08/20.  No evidence of infection.  Ertythema 2.5cm central ulcer 1.0cm  Images     Assessment & Plan  Epidermal cyst R peri areolar area Previously excised and new recurrence close to or at same area recently that inflamed and drained - now settled down with scar. Benign observe.  Discussed if flares again will refer pt to general surgeon to excise as it may involve breast tissue and areola.  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 txt, no evidence of recurrence No evidence of infection.  Pt may be too aggressive with wound care and also advised lower leg will take much longer to heal than other areas of body.  Restart mupirocin oint to wound.  Recommend changing dressing qod.   Return in about 2 weeks (around 02/21/2020) for recheck wound.   I, [name], RMA, am acting as scribe for Sarina Ser, MD .

## 2020-02-21 ENCOUNTER — Ambulatory Visit: Payer: BC Managed Care – PPO | Admitting: Dermatology

## 2020-02-21 DIAGNOSIS — M546 Pain in thoracic spine: Secondary | ICD-10-CM | POA: Diagnosis not present

## 2020-02-21 DIAGNOSIS — E291 Testicular hypofunction: Secondary | ICD-10-CM | POA: Diagnosis not present

## 2020-02-21 DIAGNOSIS — E559 Vitamin D deficiency, unspecified: Secondary | ICD-10-CM | POA: Diagnosis not present

## 2020-02-21 DIAGNOSIS — Z720 Tobacco use: Secondary | ICD-10-CM | POA: Diagnosis not present

## 2020-02-21 DIAGNOSIS — E782 Mixed hyperlipidemia: Secondary | ICD-10-CM | POA: Diagnosis not present

## 2020-02-21 DIAGNOSIS — R739 Hyperglycemia, unspecified: Secondary | ICD-10-CM | POA: Diagnosis not present

## 2020-02-21 DIAGNOSIS — Z125 Encounter for screening for malignant neoplasm of prostate: Secondary | ICD-10-CM | POA: Diagnosis not present

## 2020-02-21 DIAGNOSIS — I10 Essential (primary) hypertension: Secondary | ICD-10-CM | POA: Diagnosis not present

## 2020-02-21 DIAGNOSIS — Z0000 Encounter for general adult medical examination without abnormal findings: Secondary | ICD-10-CM | POA: Diagnosis not present

## 2020-02-21 DIAGNOSIS — M4807 Spinal stenosis, lumbosacral region: Secondary | ICD-10-CM | POA: Diagnosis not present

## 2020-02-21 DIAGNOSIS — J439 Emphysema, unspecified: Secondary | ICD-10-CM | POA: Diagnosis not present

## 2020-02-28 ENCOUNTER — Encounter: Payer: Self-pay | Admitting: Dermatology

## 2020-02-28 ENCOUNTER — Ambulatory Visit: Payer: BC Managed Care – PPO | Admitting: Dermatology

## 2020-02-28 ENCOUNTER — Other Ambulatory Visit: Payer: Self-pay

## 2020-02-28 DIAGNOSIS — L209 Atopic dermatitis, unspecified: Secondary | ICD-10-CM

## 2020-02-28 DIAGNOSIS — Z85828 Personal history of other malignant neoplasm of skin: Secondary | ICD-10-CM | POA: Diagnosis not present

## 2020-02-28 DIAGNOSIS — L720 Epidermal cyst: Secondary | ICD-10-CM

## 2020-02-28 NOTE — Progress Notes (Signed)
   Follow-Up Visit   Subjective  Matthew Kelly is a 64 y.o. male who presents for the following: recheck ulcer from [facility] SCC (L lat ankle, 2wk f/u, mupirocin oint and bandaid qd), Cyst (R chest, no drainage, using mupirocin oint), and Dermatitis (legs, elbows, using samples of eucrisa).   The following portions of the chart were reviewed this encounter and updated as appropriate: Tobacco  Allergies  Meds  Problems  Med Hx  Surg Hx  Fam Hx      Review of Systems: No other skin or systemic complaints.  Objective  Well appearing patient in no apparent distress; mood and affect are within normal limits.  A focused examination was performed including L ankle, chest, arms. Relevant physical exam findings are noted in the Assessment and Plan.  Objective  legs, elbows: Scaly erythematous papules and patches legs, elbows  Objective  R peri areolar area: Calm today, no inflammation, no drainage.   Objective  Left lat Ankle: healed ulcer with no evidence of recurrence  Assessment & Plan  Atopic dermatitis, unspecified type legs, elbows  Cont Euricsa oint prn flares.  Epidermal cyst R peri areolar area  Calm today.  If continues to flare, will refer pt to general surgeon, Dr. Bary Castilla  History of SCC (squamous cell carcinoma) of skin Left lat Ankle  With hx of normal healing ulcer from [facility] resolved.  Observe.    Return in about 4 months (around 06/29/2020).   I, [name], RMA, am acting as scribe for Sarina Ser, MD . Documentation: I have reviewed the above documentation for accuracy and completeness, and I agree with the above.  Sarina Ser, MD

## 2020-04-08 ENCOUNTER — Encounter: Payer: Self-pay | Admitting: Dermatology

## 2020-04-10 ENCOUNTER — Ambulatory Visit: Payer: Self-pay | Admitting: Dermatology

## 2020-06-05 DIAGNOSIS — M5416 Radiculopathy, lumbar region: Secondary | ICD-10-CM | POA: Diagnosis not present

## 2020-06-05 DIAGNOSIS — R739 Hyperglycemia, unspecified: Secondary | ICD-10-CM | POA: Diagnosis not present

## 2020-06-05 DIAGNOSIS — M4807 Spinal stenosis, lumbosacral region: Secondary | ICD-10-CM | POA: Diagnosis not present

## 2020-06-05 DIAGNOSIS — K589 Irritable bowel syndrome without diarrhea: Secondary | ICD-10-CM | POA: Diagnosis not present

## 2020-06-12 ENCOUNTER — Other Ambulatory Visit: Payer: Self-pay | Admitting: Gastroenterology

## 2020-06-12 DIAGNOSIS — K760 Fatty (change of) liver, not elsewhere classified: Secondary | ICD-10-CM

## 2020-06-12 DIAGNOSIS — R109 Unspecified abdominal pain: Secondary | ICD-10-CM | POA: Diagnosis not present

## 2020-06-12 DIAGNOSIS — Z8619 Personal history of other infectious and parasitic diseases: Secondary | ICD-10-CM | POA: Diagnosis not present

## 2020-06-12 DIAGNOSIS — I5032 Chronic diastolic (congestive) heart failure: Secondary | ICD-10-CM | POA: Diagnosis not present

## 2020-06-12 DIAGNOSIS — K2970 Gastritis, unspecified, without bleeding: Secondary | ICD-10-CM | POA: Diagnosis not present

## 2020-06-18 ENCOUNTER — Ambulatory Visit
Admission: RE | Admit: 2020-06-18 | Discharge: 2020-06-18 | Disposition: A | Discharge status: Home / Self Care | Payer: BC Managed Care – PPO | Source: Ambulatory Visit | Attending: Gastroenterology | Admitting: Gastroenterology

## 2020-06-18 ENCOUNTER — Other Ambulatory Visit: Payer: Self-pay

## 2020-06-18 DIAGNOSIS — K760 Fatty (change of) liver, not elsewhere classified: Secondary | ICD-10-CM | POA: Insufficient documentation

## 2020-06-18 DIAGNOSIS — Z8619 Personal history of other infectious and parasitic diseases: Secondary | ICD-10-CM | POA: Diagnosis not present

## 2020-06-18 DIAGNOSIS — B1920 Unspecified viral hepatitis C without hepatic coma: Secondary | ICD-10-CM | POA: Diagnosis not present

## 2020-06-18 DIAGNOSIS — N281 Cyst of kidney, acquired: Secondary | ICD-10-CM | POA: Diagnosis not present

## 2020-06-21 DIAGNOSIS — I359 Nonrheumatic aortic valve disorder, unspecified: Secondary | ICD-10-CM | POA: Diagnosis not present

## 2020-06-21 DIAGNOSIS — Z95828 Presence of other vascular implants and grafts: Secondary | ICD-10-CM | POA: Diagnosis not present

## 2020-06-21 DIAGNOSIS — Z9889 Other specified postprocedural states: Secondary | ICD-10-CM | POA: Diagnosis not present

## 2020-06-21 DIAGNOSIS — Z8679 Personal history of other diseases of the circulatory system: Secondary | ICD-10-CM | POA: Diagnosis not present

## 2020-06-21 DIAGNOSIS — Z952 Presence of prosthetic heart valve: Secondary | ICD-10-CM | POA: Diagnosis not present

## 2020-06-21 DIAGNOSIS — Z8774 Personal history of (corrected) congenital malformations of heart and circulatory system: Secondary | ICD-10-CM | POA: Diagnosis not present

## 2020-07-04 ENCOUNTER — Ambulatory Visit: Payer: BC Managed Care – PPO | Admitting: Dermatology

## 2020-07-04 ENCOUNTER — Other Ambulatory Visit: Payer: Self-pay

## 2020-07-04 DIAGNOSIS — D171 Benign lipomatous neoplasm of skin and subcutaneous tissue of trunk: Secondary | ICD-10-CM

## 2020-07-04 DIAGNOSIS — Z86007 Personal history of in-situ neoplasm of skin: Secondary | ICD-10-CM | POA: Diagnosis not present

## 2020-07-04 DIAGNOSIS — D692 Other nonthrombocytopenic purpura: Secondary | ICD-10-CM | POA: Diagnosis not present

## 2020-07-04 DIAGNOSIS — L720 Epidermal cyst: Secondary | ICD-10-CM | POA: Diagnosis not present

## 2020-07-04 MED ORDER — DOXYCYCLINE MONOHYDRATE 100 MG PO CAPS: 100.0000 mg | ORAL_CAPSULE | Freq: Two times a day (BID) | ORAL | 1 refills | Status: DC

## 2020-07-04 NOTE — Progress Notes (Signed)
   Follow-Up Visit   Subjective  Matthew Kelly is a 64 y.o. male who presents for the following: Hx SCC (L lat ankle, EDC 01/08/2020, pt says tingles sometimes) and bump (R forearm ~3wks, no symptoms).  He has several other areas to check.  The following portions of the chart were reviewed this encounter and updated as appropriate:  Tobacco  Allergies  Meds  Problems  Med Hx  Surg Hx  Fam Hx     Review of Systems:  No other skin or systemic complaints except as noted in HPI or Assessment and Plan.  Objective  Well appearing patient in no apparent distress; mood and affect are within normal limits.  A focused examination was performed including R arm, L ankle, trunk. Relevant physical exam findings are noted in the Assessment and Plan.  Objective  Right Forearm - Posterior: Violaceous macules and patche  Objective  Right Abdomen (side) - Upper: 1.2cm rubbery nodule  Objective  L lat ankle: Well healed scar with no evidence of recurrence  Objective  R peri areolar area: Cystic pap, calm today, no inflammation, no drainage   Assessment & Plan  Purpura ([place]) Right Forearm - Posterior Benign, Observe Can use OTC arnica containing moisturizer such as Dermend Bruise Formula if desired  Lipoma of torso Right Abdomen (side) - Upper Vs Cyst Benign appearing , observe.  Discussed excising if gets larger and symptomatic and pt desires to treat  History of squamous cell carcinoma in situ L lat ankle Clear with some dyschromia.  It should improve with time.  Observe for recurrence. Call clinic for new or changing lesions.  Recommend regular skin exams, daily broad-spectrum spf 30+ sunscreen use, and photoprotection.     Epidermal cyst R peri areolar area Benign, Observe Cont Doxycycline 100mg  1 po qd/bid with food and drink prn flares  doxycycline (MONODOX) 100 MG capsule - R peri areolar area  Return in about 6 months (around 01/04/2021) for TBSE.  I, [name],  RMA, am acting as scribe for Matthew Ser, MD .  Documentation: I have reviewed the above documentation for accuracy and completeness, and I agree with the above.  Matthew Ser, MD

## 2020-07-04 NOTE — Patient Instructions (Addendum)
Eye Doctor Darleen Crocker, in [place] with [facility]

## 2020-07-09 DIAGNOSIS — Q231 Congenital insufficiency of aortic valve: Secondary | ICD-10-CM | POA: Diagnosis not present

## 2020-07-09 DIAGNOSIS — I10 Essential (primary) hypertension: Secondary | ICD-10-CM | POA: Diagnosis not present

## 2020-07-09 DIAGNOSIS — I5032 Chronic diastolic (congestive) heart failure: Secondary | ICD-10-CM | POA: Diagnosis not present

## 2020-07-09 DIAGNOSIS — I359 Nonrheumatic aortic valve disorder, unspecified: Secondary | ICD-10-CM | POA: Diagnosis not present

## 2020-07-15 ENCOUNTER — Encounter: Payer: Self-pay | Admitting: Dermatology

## 2020-08-22 DIAGNOSIS — M5416 Radiculopathy, lumbar region: Secondary | ICD-10-CM | POA: Diagnosis not present

## 2020-08-22 DIAGNOSIS — Z23 Encounter for immunization: Secondary | ICD-10-CM | POA: Diagnosis not present

## 2020-08-22 DIAGNOSIS — M4807 Spinal stenosis, lumbosacral region: Secondary | ICD-10-CM | POA: Diagnosis not present

## 2020-08-22 DIAGNOSIS — E291 Testicular hypofunction: Secondary | ICD-10-CM | POA: Diagnosis not present

## 2020-08-22 DIAGNOSIS — R739 Hyperglycemia, unspecified: Secondary | ICD-10-CM | POA: Diagnosis not present

## 2020-08-22 DIAGNOSIS — Z952 Presence of prosthetic heart valve: Secondary | ICD-10-CM | POA: Diagnosis not present

## 2020-08-28 ENCOUNTER — Other Ambulatory Visit: Payer: Self-pay | Admitting: Dermatology

## 2020-08-28 DIAGNOSIS — L720 Epidermal cyst: Secondary | ICD-10-CM

## 2020-10-09 DIAGNOSIS — H43813 Vitreous degeneration, bilateral: Secondary | ICD-10-CM | POA: Diagnosis not present

## 2020-12-10 DIAGNOSIS — H2511 Age-related nuclear cataract, right eye: Secondary | ICD-10-CM | POA: Diagnosis not present

## 2020-12-10 DIAGNOSIS — E7800 Pure hypercholesterolemia, unspecified: Secondary | ICD-10-CM | POA: Diagnosis not present

## 2020-12-11 ENCOUNTER — Other Ambulatory Visit: Payer: Self-pay

## 2020-12-11 ENCOUNTER — Encounter: Payer: Self-pay | Admitting: Ophthalmology

## 2020-12-13 ENCOUNTER — Other Ambulatory Visit: Payer: Self-pay

## 2020-12-13 ENCOUNTER — Other Ambulatory Visit
Admission: RE | Admit: 2020-12-13 | Discharge: 2020-12-13 | Disposition: A | Discharge status: Home / Self Care | Payer: Medicare Other | Source: Ambulatory Visit | Attending: Ophthalmology | Admitting: Ophthalmology

## 2020-12-13 DIAGNOSIS — Z20822 Contact with and (suspected) exposure to covid-19: Secondary | ICD-10-CM | POA: Insufficient documentation

## 2020-12-13 DIAGNOSIS — Z01812 Encounter for preprocedural laboratory examination: Secondary | ICD-10-CM | POA: Insufficient documentation

## 2020-12-13 LAB — SARS CORONAVIRUS 2 (TAT 6-24 HRS): SARS Coronavirus 2: NEGATIVE

## 2020-12-13 NOTE — Discharge Instructions (Signed)

## 2020-12-16 NOTE — Anesthesia Preprocedure Evaluation (Signed)
Anesthesia Evaluation  Patient identified by MRN, date of birth, ID band Patient awake    Reviewed: Allergy & Precautions, NPO status , Patient's Chart, lab work & pertinent test results  History of Anesthesia Complications Negative for: history of anesthetic complications  Airway Mallampati: II   Neck ROM: Full    Dental  (+) Implants   Pulmonary Current Smoker (1/2 ppd)Patient did not abstain from smoking.,    Pulmonary exam normal breath sounds clear to auscultation       Cardiovascular hypertension, + Valvular Problems/Murmurs (bicuspid aortic valve s/p replacement 2014)  Rhythm:Regular Rate:Normal + Systolic murmurs Thoracic ascending aortic aneurysm s/p repair 2014   Neuro/Psych PSYCHIATRIC DISORDERS Anxiety    GI/Hepatic GERD  ,(+) Hepatitis -, C  Endo/Other  negative endocrine ROS  Renal/GU negative Renal ROS     Musculoskeletal  (+) Arthritis ,   Abdominal   Peds  Hematology negative hematology ROS (+)   Anesthesia Other Findings Cardiology note 07/09/20:  1. Essential hypertension, benign-Will continue with current regimen including a dash diet. Blood pressure appears to be well controlled. I10 401.1  2. Insomnia, transient-discontinue zolpidem as it is not working and try trazodone at 50 mg nightly. Risk and benefits of this drug were discussed . F51.02 307.41  3. Bicuspid aortic valve-status post prosthetic aortic valve placement. Is doing fairly well . MRA shows a slightly increased peak flow velocity of 3.6 m/s up from 2.1. Will be repeated in 1 year. Given earlier symptoms and more shortness of breath as well extensive concern about progression of his valvular heart disease we will repeat echocardiogram this fall. Will continue to follow and he will follow-up with thoracic surgery. Q23.1 746.4  4. Thoracic ascending aortic aneurysm-status post repair. Currently stable without symptoms. Continue to follow up  with thoracic surgery at [facility] I71.2 441.2  5. H/O aortic valve replacement with tissue graft. Echo showed no appreciable change in the past however will repeat to follow-up for any interval change. Cardiac MR a showed well-seated valve. Given his increased symptoms we will proceed with a repeat echocardiogram this fall to determine if there is any interval progression of his valvular disease. Z95.4 V42.2  6. Mixed hyperlipidemia-continue with simvastatin at 20 mg daily as well as low-fat diet. LDL goal of less than 100 E78.2 272.2   7. Bronchospasm and shortness of breath-PFT showed mild OPD. No significant improvement with bronchodilators. Discussed at length his CT scan of his chest and lungs. Discussed the importance of close follow-up with his pulmonologist. We reviewed atherosclerosis of his aorta.  8. Gastric bloating-at his request will refer to gastroenterology.  Visit at approximately 30 minutes greater than 50% of the time spent on clinical decision making.  Return in about 6 months (around 01/09/2021).    Reproductive/Obstetrics                            Anesthesia Physical Anesthesia Plan  ASA: III  Anesthesia Plan: MAC   Post-op Pain Management:    Induction: Intravenous  PONV Risk Score and Plan: 0 and TIVA, Midazolam and Treatment may vary due to age or medical condition  Airway Management Planned: Nasal Cannula  Additional Equipment:   Intra-op Plan:   Post-operative Plan:   Informed Consent: I have reviewed the patients History and Physical, chart, labs and discussed the procedure including the risks, benefits and alternatives for the proposed anesthesia with the patient or authorized representative  who has indicated his/her understanding and acceptance.       Plan Discussed with: CRNA  Anesthesia Plan Comments:        Anesthesia Quick Evaluation

## 2020-12-17 ENCOUNTER — Encounter: Payer: Self-pay | Admitting: Ophthalmology

## 2020-12-17 ENCOUNTER — Ambulatory Visit: Payer: Medicare Other | Admitting: Anesthesiology

## 2020-12-17 ENCOUNTER — Ambulatory Visit
Admission: RE | Admit: 2020-12-17 | Discharge: 2020-12-17 | Disposition: A | Discharge status: Home / Self Care | Payer: Medicare Other | Attending: Ophthalmology | Admitting: Ophthalmology

## 2020-12-17 ENCOUNTER — Encounter
Admission: RE | Disposition: A | Discharge status: Home / Self Care | Payer: Self-pay | Source: Home / Self Care | Attending: Ophthalmology

## 2020-12-17 ENCOUNTER — Other Ambulatory Visit: Payer: Self-pay

## 2020-12-17 DIAGNOSIS — Z8249 Family history of ischemic heart disease and other diseases of the circulatory system: Secondary | ICD-10-CM | POA: Diagnosis not present

## 2020-12-17 DIAGNOSIS — F17210 Nicotine dependence, cigarettes, uncomplicated: Secondary | ICD-10-CM | POA: Insufficient documentation

## 2020-12-17 DIAGNOSIS — Z79890 Hormone replacement therapy (postmenopausal): Secondary | ICD-10-CM | POA: Insufficient documentation

## 2020-12-17 DIAGNOSIS — Z809 Family history of malignant neoplasm, unspecified: Secondary | ICD-10-CM | POA: Insufficient documentation

## 2020-12-17 DIAGNOSIS — Z79899 Other long term (current) drug therapy: Secondary | ICD-10-CM | POA: Insufficient documentation

## 2020-12-17 DIAGNOSIS — H2511 Age-related nuclear cataract, right eye: Secondary | ICD-10-CM | POA: Diagnosis not present

## 2020-12-17 DIAGNOSIS — Z888 Allergy status to other drugs, medicaments and biological substances status: Secondary | ICD-10-CM | POA: Insufficient documentation

## 2020-12-17 DIAGNOSIS — Z952 Presence of prosthetic heart valve: Secondary | ICD-10-CM | POA: Diagnosis not present

## 2020-12-17 DIAGNOSIS — Z7982 Long term (current) use of aspirin: Secondary | ICD-10-CM | POA: Diagnosis not present

## 2020-12-17 DIAGNOSIS — Z85828 Personal history of other malignant neoplasm of skin: Secondary | ICD-10-CM | POA: Diagnosis not present

## 2020-12-17 DIAGNOSIS — H25811 Combined forms of age-related cataract, right eye: Secondary | ICD-10-CM | POA: Diagnosis not present

## 2020-12-17 HISTORY — PX: CATARACT EXTRACTION W/PHACO: SHX586

## 2020-12-17 HISTORY — DX: Unspecified viral hepatitis C without hepatic coma: B19.20

## 2020-12-17 SURGERY — PHACOEMULSIFICATION, CATARACT, WITH IOL INSERTION
Anesthesia: Monitor Anesthesia Care | Site: Eye | Laterality: Right

## 2020-12-17 MED ORDER — ACETAMINOPHEN 160 MG/5ML PO SOLN: 325.0000 mg | ORAL | Status: DC | PRN

## 2020-12-17 MED ORDER — ACETAMINOPHEN 325 MG PO TABS: 650.0000 mg | ORAL_TABLET | Freq: Once | ORAL | Status: DC | PRN

## 2020-12-17 MED ORDER — FENTANYL CITRATE (PF) 100 MCG/2ML IJ SOLN
INTRAMUSCULAR | Status: DC | PRN
  Administered 2020-12-17: 50 ug via INTRAVENOUS

## 2020-12-17 MED ORDER — LACTATED RINGERS IV SOLN: INTRAVENOUS | Status: DC

## 2020-12-17 MED ORDER — NA CHONDROIT SULF-NA HYALURON 40-17 MG/ML IO SOLN
INTRAOCULAR | Status: DC | PRN
  Administered 2020-12-17: 1 mL via INTRAOCULAR

## 2020-12-17 MED ORDER — TETRACAINE HCL 0.5 % OP SOLN
1.0000 [drp] | OPHTHALMIC | Status: DC | PRN
  Administered 2020-12-17 (×2): 1 [drp] via OPHTHALMIC

## 2020-12-17 MED ORDER — ONDANSETRON HCL 4 MG/2ML IJ SOLN: 4.0000 mg | Freq: Once | INTRAMUSCULAR | Status: DC | PRN

## 2020-12-17 MED ORDER — BRIMONIDINE TARTRATE-TIMOLOL 0.2-0.5 % OP SOLN
OPHTHALMIC | Status: DC | PRN
  Administered 2020-12-17: 1 [drp] via OPHTHALMIC

## 2020-12-17 MED ORDER — ARMC OPHTHALMIC DILATING DROPS
1.0000 "application " | OPHTHALMIC | Status: DC | PRN
  Administered 2020-12-17 (×3): 1 via OPHTHALMIC

## 2020-12-17 MED ORDER — LIDOCAINE HCL (PF) 2 % IJ SOLN
INTRAOCULAR | Status: DC | PRN
  Administered 2020-12-17: 1 mL

## 2020-12-17 MED ORDER — EPINEPHRINE PF 1 MG/ML IJ SOLN
INTRAOCULAR | Status: DC | PRN
  Administered 2020-12-17: 52 mL via OPHTHALMIC

## 2020-12-17 MED ORDER — MOXIFLOXACIN HCL 0.5 % OP SOLN
OPHTHALMIC | Status: DC | PRN
  Administered 2020-12-17: 0.2 mL via OPHTHALMIC

## 2020-12-17 MED ORDER — MIDAZOLAM HCL 2 MG/2ML IJ SOLN
INTRAMUSCULAR | Status: DC | PRN
  Administered 2020-12-17: 1 mg via INTRAVENOUS

## 2020-12-17 SURGICAL SUPPLY — 19 items
CANNULA ANT/CHMB 27G (MISCELLANEOUS) ×2 IMPLANT
CANNULA ANT/CHMB 27GA (MISCELLANEOUS) ×4 IMPLANT
GLOVE SURG LX 8.0 MICRO (GLOVE) ×1
GLOVE SURG LX STRL 8.0 MICRO (GLOVE) ×1 IMPLANT
GLOVE SURG TRIUMPH 8.0 PF LTX (GLOVE) ×2 IMPLANT
GOWN STRL REUS W/ TWL LRG LVL3 (GOWN DISPOSABLE) ×2 IMPLANT
GOWN STRL REUS W/TWL LRG LVL3 (GOWN DISPOSABLE) ×4
LENS IOL TECNIS EYHANCE 21.0 (Intraocular Lens) ×1 IMPLANT
MARKER SKIN DUAL TIP RULER LAB (MISCELLANEOUS) ×2 IMPLANT
NDL FILTER BLUNT 18X1 1/2 (NEEDLE) ×1 IMPLANT
NEEDLE FILTER BLUNT 18X 1/2SAF (NEEDLE) ×1
NEEDLE FILTER BLUNT 18X1 1/2 (NEEDLE) ×1 IMPLANT
PACK EYE AFTER SURG (MISCELLANEOUS) ×2 IMPLANT
PACK OPTHALMIC (MISCELLANEOUS) ×2 IMPLANT
PACK PORFILIO (MISCELLANEOUS) ×2 IMPLANT
SYR 3ML LL SCALE MARK (SYRINGE) ×2 IMPLANT
SYR TB 1ML LUER SLIP (SYRINGE) ×2 IMPLANT
WATER STERILE IRR 250ML POUR (IV SOLUTION) ×2 IMPLANT
WIPE NON LINTING 3.25X3.25 (MISCELLANEOUS) ×2 IMPLANT

## 2020-12-17 NOTE — Anesthesia Procedure Notes (Signed)
Procedure Name: MAC Date/Time: 12/17/2020 11:49 AM Performed by: Cameron Ali, CRNA Pre-anesthesia Checklist: Patient identified, Emergency Drugs available, Suction available, Timeout performed and Patient being monitored Patient Re-evaluated:Patient Re-evaluated prior to induction Oxygen Delivery Method: Nasal cannula Placement Confirmation: positive ETCO2

## 2020-12-17 NOTE — Anesthesia Postprocedure Evaluation (Signed)
Anesthesia Post Note  Patient: Matthew Kelly  Procedure(s) Performed: CATARACT EXTRACTION PHACO AND INTRAOCULAR LENS PLACEMENT (IOC) RIGHT 6.67 00:49.5 (Right Eye)     Patient location during evaluation: PACU Anesthesia Type: MAC Level of consciousness: awake and alert, oriented and patient cooperative Pain management: pain level controlled Vital Signs Assessment: post-procedure vital signs reviewed and stable Respiratory status: spontaneous breathing, nonlabored ventilation and respiratory function stable Cardiovascular status: blood pressure returned to baseline and stable Postop Assessment: adequate PO intake Anesthetic complications: no   No complications documented.  Darrin Nipper

## 2020-12-17 NOTE — H&P (Signed)
[facility]   Primary Care Physician:  Josetta Huddle, MD Ophthalmologist: Dr. George Ina  Pre-Procedure History & Physical: HPI:  Matthew Kelly is a 65 y.o. male here for cataract surgery.   Past Medical History:  Diagnosis Date  . Anxiety   . Arthritis   . GERD (gastroesophageal reflux disease)   . Heart murmur   . Hepatitis C    dx'd 1980s.  treated with Harvoni approx 2015  . Hypertension   . Squamous cell carcinoma of skin 12/18/2019   SCCIS L lat ankle  . Squamous cell carcinoma of skin 11/07/2008   SCCIS L wrist    Past Surgical History:  Procedure Laterality Date  . AORTIC VALVE REPLACEMENT  05/2001  . COLONOSCOPY WITH PROPOFOL N/A 01/04/2017   Procedure: COLONOSCOPY WITH PROPOFOL;  Surgeon: Garlan Fair, MD;  Location: [facility];  Service: Endoscopy;  Laterality: N/A;    Prior to Admission medications   Medication Sig Start Date End Date Taking? Authorizing Provider  Alpha-Lipoic Acid 100 MG CAPS Take 200 mg by mouth daily.   Yes [provider]  ALPRAZolam Duanne Moron) 0.5 MG tablet Take 0.5 mg by mouth at bedtime as needed for anxiety.   Yes [provider]  amoxicillin (AMOXIL) 500 MG capsule Take 500 mg by mouth as needed.   Yes [provider]  Arginine 1000 MG TABS Take by mouth daily.   Yes [provider]  aspirin 81 MG tablet Take 81 mg by mouth daily.   Yes [provider]  BIOTIN PO Take 1 tablet by mouth daily.   Yes [provider]  cholecalciferol (VITAMIN D) 1000 UNITS tablet Take 5,000 Units by mouth daily.   Yes [provider]  CINNAMON PO Take 1,000 mg by mouth daily.   Yes [provider]  Coenzyme Q10 (COQ10) 100 MG CAPS Take 100 mg by mouth daily.   Yes [provider]  Cyanocobalamin (B-12) 2500 MCG SUBL Place 1 tablet under the tongue 2 (two) times daily.   Yes [provider]  cyclobenzaprine (FLEXERIL) 10 MG tablet Take 10 mg by mouth 3 (three)  times daily as needed for muscle spasms.   Yes [provider]  dicyclomine (BENTYL) 20 MG tablet Take 40 mg by mouth daily as needed for spasms.   Yes [provider]  diphenhydrAMINE (BENADRYL) 25 MG tablet Take 25 mg by mouth every 6 (six) hours as needed.   Yes [provider]  diphenoxylate-atropine (LOMOTIL) 2.5-0.025 MG per tablet Take 1 tablet by mouth 4 (four) times daily as needed for diarrhea or loose stools.   Yes [provider]  doxycycline (MONODOX) 100 MG capsule TAKE 1 CAPSULE BY MOUTH TWICE A DAY AS NEEDED FOR FLARES. TAKE WITH FOOD AND DRINK 08/28/20  Yes Ralene Bathe, MD  fexofenadine (ALLEGRA) 180 MG tablet Take 180 mg by mouth daily.   Yes [provider]  Ginkgo Biloba (GINKOBA PO) Take 120 mg by mouth in the morning and at bedtime.   Yes [provider]  GuaiFENesin (MUCINEX PO) Take 1 tablet by mouth daily as needed.   Yes [provider]  losartan (COZAAR) 50 MG tablet Take 50 mg by mouth daily.   Yes [provider]  melatonin 5 MG TABS Take 5 mg by mouth at bedtime as needed.   Yes [provider]  metoprolol (LOPRESSOR) 50 MG tablet Take 50 mg by mouth 2 (two) times daily.   Yes [provider]  milk thistle 175 MG tablet Take 175 mg by mouth daily.    Yes [provider]  mometasone (NASONEX) 50 MCG/ACT nasal spray INHALE 2 PUFFS IN EACH NOSTRIL DAILY. 12/19/15  Yes [provider]  Multiple Vitamin (MULTIVITAMIN WITH MINERALS) TABS tablet Take 1 tablet by mouth daily.   Yes [provider]  multivitamin-lutein (OCUVITE-LUTEIN) CAPS capsule Take 1 capsule by mouth daily.   Yes [provider]  naproxen sodium (ALEVE) 220 MG tablet Take 220 mg by mouth daily as needed.   Yes [provider]  Omega-3 Fatty Acids (FISH OIL) 1000 MG CAPS Take 1 capsule by mouth 2 (two) times daily.    Yes [provider]  omeprazole (PRILOSEC) 20  MG capsule Take 20 mg by mouth daily.   Yes [provider]  pregabalin (LYRICA) 50 MG capsule Take 50 mg by mouth daily as needed (pain).   Yes [provider]  Riboflavin (B-2) 50 MG TABS Take 1 tablet by mouth daily.   Yes [provider]  Saw Palmetto 450 MG CAPS Take by mouth daily.   Yes [provider]  Selenium (SELENIMIN-200 PO) Take 200 mg by mouth daily.   Yes [provider]  simethicone (MYLICON) 80 MG chewable tablet Chew 80 mg by mouth daily.   Yes [provider]  simvastatin (ZOCOR) 20 MG tablet Take 20 mg by mouth daily at 6 PM.    Yes [provider]  Testosterone 40.5 MG/2.5GM (1.62%) GEL Place 1 application onto the skin every other day.    Yes [provider]  traZODone (DESYREL) 50 MG tablet Take 50 mg by mouth at bedtime as needed for sleep.   Yes [provider]  Turmeric 450 MG CAPS Take by mouth daily.   Yes [provider]  vitamin C (ASCORBIC ACID) 500 MG tablet Take 500 mg by mouth 2 (two) times daily.   Yes [provider]  vitamin E 200 UNIT capsule Take 200 Units by mouth daily.   Yes [provider]  VOLTAREN 1 % GEL Apply 2 g topically 2 (two) times daily as needed (pain).  12/20/14  Yes [provider]  zinc gluconate 50 MG tablet Take 50 mg by mouth daily.   Yes [provider]  zolpidem (AMBIEN) 5 MG tablet Take 5 mg by mouth at bedtime as needed for sleep.   Yes [provider]  mupirocin ointment (BACTROBAN) 2 % Apply 1 application topically daily. 02/07/20   Ralene Bathe, MD  oxyCODONE (OXY IR/ROXICODONE) 5 MG immediate release tablet Take 10 mg by mouth 2 (two) times daily as needed for severe pain. Per patient he takes it 4-5 times per month  Patient not taking: Reported on 12/11/2020    [provider]  sildenafil (REVATIO) 20 MG tablet Take 20 mg by mouth as needed (ED). Patient not taking: Reported on  12/11/2020    [provider]    Allergies as of 10/17/2020 - Review Complete 07/15/2020  Allergen Reaction Noted  . Avelox [moxifloxacin]  06/20/2014  . Lexapro [escitalopram oxalate]  06/20/2014  . Rifampin  06/20/2014  . Wellbutrin [bupropion]  06/20/2014    Family History  Problem Relation Age of Onset  . Cancer Mother   . Heart disease Father     Social History   Socioeconomic History  . Marital status: Married    Spouse name: Not on file  . Number of children: Not on file  .  Years of education: Not on file  . Highest education level: Not on file  Occupational History  . Not on file  Tobacco Use  . Smoking status: Current Every Day Smoker    Packs/day: 0.50    Types: Cigarettes    Start date: 11/16/1978  . Smokeless tobacco: Never Used  Vaping Use  . Vaping Use: Never used  Substance and Sexual Activity  . Alcohol use: Yes    Alcohol/week: 3.0 standard drinks    Types: 3 Standard drinks or equivalent per week    Comment: occasional  . Drug use: No  . Sexual activity: Yes  Other Topics Concern  . Not on file  Social History Narrative  . Not on file   Social Determinants of Health   Financial Resource Strain: Not on file  Food Insecurity: Not on file  Transportation Needs: Not on file  Physical Activity: Not on file  Stress: Not on file  Social Connections: Not on file  Intimate Partner Violence: Not on file    Review of Systems: See HPI, otherwise negative ROS  Physical Exam: BP (!) 146/90   Pulse 74   Temp (!) 97.1 F (36.2 C)   Ht 6\' 4"  (1.93 m)   Wt 90.7 kg   SpO2 97%   BMI 24.34 kg/m  General:   Alert,  pleasant and cooperative in NAD Head:  Normocephalic and atraumatic. Respiratory:  Normal work of breathing.  Impression/Plan: Matthew Kelly is here for cataract surgery.  Risks, benefits, limitations, and alternatives regarding cataract surgery have been reviewed with the patient.  Questions have been answered.  All parties  agreeable.   Birder Robson, MD  12/17/2020, 11:41 AM

## 2020-12-17 NOTE — Transfer of Care (Signed)
Immediate Anesthesia Transfer of Care Note  Patient: Matthew Kelly  Procedure(s) Performed: CATARACT EXTRACTION PHACO AND INTRAOCULAR LENS PLACEMENT (IOC) RIGHT 6.67 00:49.5 (Right Eye)  Patient Location: PACU  Anesthesia Type: MAC  Level of Consciousness: awake, alert  and patient cooperative  Airway and Oxygen Therapy: Patient Spontanous Breathing and Patient connected to supplemental oxygen  Post-op Assessment: Post-op Vital signs reviewed, Patient's Cardiovascular Status Stable, Respiratory Function Stable, Patent Airway and No signs of Nausea or vomiting  Post-op Vital Signs: Reviewed and stable  Complications: No complications documented.

## 2020-12-17 NOTE — Op Note (Signed)
PREOPERATIVE DIAGNOSIS:  Nuclear sclerotic cataract of the right eye.   POSTOPERATIVE DIAGNOSIS:  H25.11 Cataract   OPERATIVE PROCEDURE:@   SURGEON:  Birder Robson, MD.   ANESTHESIA:  Anesthesiologist: Darrin Nipper, MD CRNA: Cameron Ali, CRNA  1.      Managed anesthesia care. 2.      0.81ml of Shugarcaine was instilled in the eye following the paracentesis.   COMPLICATIONS:  None.   TECHNIQUE:   Stop and chop   DESCRIPTION OF PROCEDURE:  The patient was examined and consented in the preoperative holding area where the aforementioned topical anesthesia was applied to the right eye and then brought back to the Operating Room where the right eye was prepped and draped in the usual sterile ophthalmic fashion and a lid speculum was placed. A paracentesis was created with the side port blade and the anterior chamber was filled with viscoelastic. A near clear corneal incision was performed with the steel keratome. A continuous curvilinear capsulorrhexis was performed with a cystotome followed by the capsulorrhexis forceps. Hydrodissection and hydrodelineation were carried out with BSS on a blunt cannula. The lens was removed in a stop and chop  technique and the remaining cortical material was removed with the irrigation-aspiration handpiece. The capsular bag was inflated with viscoelastic and the Technis [id number]  lens was placed in the capsular bag without complication. The remaining viscoelastic was removed from the eye with the irrigation-aspiration handpiece. The wounds were hydrated. The anterior chamber was flushed with BSS and the eye was inflated to physiologic pressure. 0.61ml of Vigamox was placed in the anterior chamber. The wounds were found to be water tight. The eye was dressed with Combigan. The patient was given protective glasses to wear throughout the day and a shield with which to sleep tonight. The patient was also given drops with which to begin a drop regimen today and will  follow-up with me in one day. Implant Name Type Inv. Item Serial No. Manufacturer Lot No. LRB No. Used Action  LENS IOL TECNIS EYHANCE 21.0 - [id number] Intraocular Lens LENS IOL TECNIS EYHANCE 21.0 [id number] JOHNSON   Right 1 Implanted   Procedure(s): CATARACT EXTRACTION PHACO AND INTRAOCULAR LENS PLACEMENT (IOC) RIGHT 6.67 00:49.5 (Right)  Electronically signed: Birder Robson 12/17/2020 12:09 PM

## 2020-12-18 ENCOUNTER — Encounter: Payer: Self-pay | Admitting: Ophthalmology

## 2020-12-23 DIAGNOSIS — H2512 Age-related nuclear cataract, left eye: Secondary | ICD-10-CM | POA: Diagnosis not present

## 2020-12-27 DIAGNOSIS — I10 Essential (primary) hypertension: Secondary | ICD-10-CM | POA: Diagnosis not present

## 2020-12-27 DIAGNOSIS — R011 Cardiac murmur, unspecified: Secondary | ICD-10-CM | POA: Diagnosis not present

## 2020-12-27 DIAGNOSIS — I5032 Chronic diastolic (congestive) heart failure: Secondary | ICD-10-CM | POA: Diagnosis not present

## 2020-12-27 DIAGNOSIS — Q231 Congenital insufficiency of aortic valve: Secondary | ICD-10-CM | POA: Diagnosis not present

## 2020-12-30 ENCOUNTER — Encounter: Payer: Self-pay | Admitting: Dermatology

## 2020-12-30 ENCOUNTER — Ambulatory Visit (INDEPENDENT_AMBULATORY_CARE_PROVIDER_SITE_OTHER): Payer: Medicare Other | Admitting: Dermatology

## 2020-12-30 ENCOUNTER — Other Ambulatory Visit: Payer: Self-pay

## 2020-12-30 DIAGNOSIS — L814 Other melanin hyperpigmentation: Secondary | ICD-10-CM

## 2020-12-30 DIAGNOSIS — D171 Benign lipomatous neoplasm of skin and subcutaneous tissue of trunk: Secondary | ICD-10-CM

## 2020-12-30 DIAGNOSIS — L738 Other specified follicular disorders: Secondary | ICD-10-CM | POA: Diagnosis not present

## 2020-12-30 DIAGNOSIS — D1800 Hemangioma unspecified site: Secondary | ICD-10-CM

## 2020-12-30 DIAGNOSIS — Z1283 Encounter for screening for malignant neoplasm of skin: Secondary | ICD-10-CM

## 2020-12-30 DIAGNOSIS — L578 Other skin changes due to chronic exposure to nonionizing radiation: Secondary | ICD-10-CM

## 2020-12-30 DIAGNOSIS — L820 Inflamed seborrheic keratosis: Secondary | ICD-10-CM

## 2020-12-30 DIAGNOSIS — Z85828 Personal history of other malignant neoplasm of skin: Secondary | ICD-10-CM | POA: Diagnosis not present

## 2020-12-30 DIAGNOSIS — D225 Melanocytic nevi of trunk: Secondary | ICD-10-CM

## 2020-12-30 DIAGNOSIS — D485 Neoplasm of uncertain behavior of skin: Secondary | ICD-10-CM

## 2020-12-30 DIAGNOSIS — S80861A Insect bite (nonvenomous), right lower leg, initial encounter: Secondary | ICD-10-CM

## 2020-12-30 DIAGNOSIS — Q825 Congenital non-neoplastic nevus: Secondary | ICD-10-CM

## 2020-12-30 DIAGNOSIS — D239 Other benign neoplasm of skin, unspecified: Secondary | ICD-10-CM

## 2020-12-30 DIAGNOSIS — L570 Actinic keratosis: Secondary | ICD-10-CM | POA: Diagnosis not present

## 2020-12-30 DIAGNOSIS — W57XXXA Bitten or stung by nonvenomous insect and other nonvenomous arthropods, initial encounter: Secondary | ICD-10-CM

## 2020-12-30 DIAGNOSIS — L821 Other seborrheic keratosis: Secondary | ICD-10-CM

## 2020-12-30 DIAGNOSIS — D229 Melanocytic nevi, unspecified: Secondary | ICD-10-CM

## 2020-12-30 HISTORY — DX: Other benign neoplasm of skin, unspecified: D23.9

## 2020-12-30 NOTE — Patient Instructions (Signed)

## 2020-12-30 NOTE — Progress Notes (Signed)
Follow-Up Visit   Subjective  Matthew Kelly is a 65 y.o. male who presents for the following: Annual Exam (History of SCC - TBSE today). The patient presents for Total-Body Skin Exam (TBSE) for skin cancer screening and mole check.  The following portions of the chart were reviewed this encounter and updated as appropriate:   Tobacco   Allergies   Meds   Problems   Med Hx   Surg Hx   Fam Hx      Review of Systems:  No other skin or systemic complaints except as noted in HPI or Assessment and Plan.  Objective  Well appearing patient in no apparent distress; mood and affect are within normal limits.  A full examination was performed including scalp, head, eyes, ears, nose, lips, neck, chest, axillae, abdomen, back, buttocks, bilateral upper extremities, bilateral lower extremities, hands, feet, fingers, toes, fingernails, and toenails. All findings within normal limits unless otherwise noted below.  Objective  Left cheek: Yellow papules  Objective  left temple: Erythematous thin papules/macules with gritty scale.   Objective  Left Shoulder - Anterior: Vascular patch  Objective  Right Wrist - Posterior: Erythematous keratotic or waxy stuck-on papule or plaque.   Objective  Rigth post waistline: 0.4 cm irregular brown macule  Objective  Left Lower Leg - Anterior: Crust  Objective  Right medial infrapectoral: Rubbery nodule   Assessment & Plan    Lentigines - Scattered tan macules - Discussed due to sun exposure - Benign, observe - Call for any changes  Seborrheic Keratoses - Stuck-on, waxy, tan-brown papules and plaques  - Discussed benign etiology and prognosis. - Observe - Call for any changes  Melanocytic Nevi - Tan-brown and/or pink-flesh-colored symmetric macules and papules - Benign appearing on exam today - Observation - Call clinic for new or changing moles - Recommend daily use of broad spectrum spf 30+ sunscreen to sun-exposed areas.    Hemangiomas - Red papules - Discussed benign nature - Observe - Call for any changes  Actinic Damage - Chronic, secondary to cumulative UV/sun exposure - diffuse scaly erythematous macules with underlying dyspigmentation - Recommend daily broad spectrum sunscreen SPF 30+ to sun-exposed areas, reapply every 2 hours as needed.  - Call for new or changing lesions.  Skin cancer screening performed today.  Sebaceous hyperplasia Left cheek Benign, observe.   AK (actinic keratosis) left temple Destruction of lesion - left temple Complexity: simple   Destruction method: cryotherapy   Informed consent: discussed and consent obtained   Timeout:  patient name, date of birth, surgical site, and procedure verified Lesion destroyed using liquid nitrogen: Yes   Region frozen until ice ball extended beyond lesion: Yes   Outcome: patient tolerated procedure well with no complications   Post-procedure details: wound care instructions given    Vascular birthmark Left Shoulder  Benign, observe.   Inflamed seborrheic keratosis Right Wrist - Posterior Destruction of lesion - Right Wrist - Posterior Complexity: simple   Destruction method: cryotherapy   Informed consent: discussed and consent obtained   Timeout:  patient name, date of birth, surgical site, and procedure verified Lesion destroyed using liquid nitrogen: Yes   Region frozen until ice ball extended beyond lesion: Yes   Outcome: patient tolerated procedure well with no complications   Post-procedure details: wound care instructions given    Neoplasm of uncertain behavior of skin Right post waistline  Epidermal / dermal shaving  Lesion diameter (cm):  0.4 Informed consent: discussed and consent obtained   Timeout:  patient name, date of birth, surgical site, and procedure verified   Procedure prep:  Patient was prepped and draped in usual sterile fashion Prep type:  Isopropyl alcohol Anesthesia: the lesion was  anesthetized in a standard fashion   Anesthetic:  1% lidocaine w/ epinephrine 1-100,000 buffered w/ 8.4% NaHCO3 Instrument used: flexible razor blade   Hemostasis achieved with: pressure, aluminum chloride and electrodesiccation   Outcome: patient tolerated procedure well   Post-procedure details: sterile dressing applied and wound care instructions given   Dressing type: bandage and petrolatum    Specimen 1 - Surgical pathology Differential Diagnosis: Nevus vs dysplastic nevus Check Margins: No 0.4 cm irregular brown macule  Insect bite of right lower leg with local reaction, initial encounter Left Lower Leg - Anterior Insect bite reaction vs crust - history of tick bite, he took Doxycycline prophylactically. Monitor for any symptoms of Lyme Disease  Lipoma of torso Right medial infrapectoral Benign, observe.   Return in about 6 months (around 2021/07/21).  I, Ashok Cordia, CMA, am acting as scribe for Sarina Ser, MD .  Documentation: I have reviewed the above documentation for accuracy and completeness, and I agree with the above.  Sarina Ser, MD

## 2020-12-31 DIAGNOSIS — K2970 Gastritis, unspecified, without bleeding: Secondary | ICD-10-CM | POA: Diagnosis not present

## 2020-12-31 DIAGNOSIS — K760 Fatty (change of) liver, not elsewhere classified: Secondary | ICD-10-CM | POA: Diagnosis not present

## 2020-12-31 DIAGNOSIS — K591 Functional diarrhea: Secondary | ICD-10-CM | POA: Diagnosis not present

## 2020-12-31 DIAGNOSIS — Z86010 Personal history of colonic polyps: Secondary | ICD-10-CM | POA: Diagnosis not present

## 2021-01-01 ENCOUNTER — Telehealth: Payer: Self-pay

## 2021-01-01 NOTE — Telephone Encounter (Signed)
-----   Message from Ralene Bathe, MD sent at 12/31/2020  6:46 PM EST ----- Diagnosis Skin , right post waistline DYSPLASTIC COMPOUND NEVUS WITH MODERATE ATYPIA  Dysplastic Moderate Recheck next visit

## 2021-01-01 NOTE — Telephone Encounter (Signed)
Patient informed of pathology results 

## 2021-01-24 ENCOUNTER — Other Ambulatory Visit: Payer: Medicare Other

## 2021-01-28 ENCOUNTER — Ambulatory Visit: Admission: RE | Admit: 2021-01-28 | Payer: Medicare Other | Source: Home / Self Care | Admitting: Ophthalmology

## 2021-01-28 ENCOUNTER — Encounter: Admission: RE | Payer: Self-pay | Source: Home / Self Care

## 2021-01-28 SURGERY — PHACOEMULSIFICATION, CATARACT, WITH IOL INSERTION
Anesthesia: Topical | Laterality: Left

## 2021-02-04 DIAGNOSIS — Q231 Congenital insufficiency of aortic valve: Secondary | ICD-10-CM | POA: Diagnosis not present

## 2021-02-27 ENCOUNTER — Other Ambulatory Visit: Payer: Self-pay | Admitting: Internal Medicine

## 2021-02-27 DIAGNOSIS — E291 Testicular hypofunction: Secondary | ICD-10-CM | POA: Diagnosis not present

## 2021-02-27 DIAGNOSIS — Z952 Presence of prosthetic heart valve: Secondary | ICD-10-CM | POA: Diagnosis not present

## 2021-02-27 DIAGNOSIS — R739 Hyperglycemia, unspecified: Secondary | ICD-10-CM | POA: Diagnosis not present

## 2021-02-27 DIAGNOSIS — M5416 Radiculopathy, lumbar region: Secondary | ICD-10-CM | POA: Diagnosis not present

## 2021-02-27 DIAGNOSIS — I10 Essential (primary) hypertension: Secondary | ICD-10-CM | POA: Diagnosis not present

## 2021-02-27 DIAGNOSIS — Z720 Tobacco use: Secondary | ICD-10-CM

## 2021-02-27 DIAGNOSIS — K589 Irritable bowel syndrome without diarrhea: Secondary | ICD-10-CM | POA: Diagnosis not present

## 2021-02-27 DIAGNOSIS — M4807 Spinal stenosis, lumbosacral region: Secondary | ICD-10-CM | POA: Diagnosis not present

## 2021-02-27 DIAGNOSIS — Z0000 Encounter for general adult medical examination without abnormal findings: Secondary | ICD-10-CM | POA: Diagnosis not present

## 2021-03-10 ENCOUNTER — Other Ambulatory Visit: Payer: Self-pay | Admitting: Internal Medicine

## 2021-03-10 DIAGNOSIS — Z720 Tobacco use: Secondary | ICD-10-CM

## 2021-03-13 ENCOUNTER — Ambulatory Visit: Payer: Medicare Other

## 2021-03-26 ENCOUNTER — Other Ambulatory Visit: Payer: Self-pay | Admitting: Ophthalmology

## 2021-03-26 ENCOUNTER — Other Ambulatory Visit (HOSPITAL_COMMUNITY): Payer: Self-pay | Admitting: Ophthalmology

## 2021-03-26 DIAGNOSIS — H53462 Homonymous bilateral field defects, left side: Secondary | ICD-10-CM

## 2021-03-26 DIAGNOSIS — H2512 Age-related nuclear cataract, left eye: Secondary | ICD-10-CM | POA: Diagnosis not present

## 2021-03-27 ENCOUNTER — Other Ambulatory Visit: Payer: Self-pay | Admitting: Internal Medicine

## 2021-03-27 ENCOUNTER — Other Ambulatory Visit: Payer: Self-pay

## 2021-03-27 ENCOUNTER — Ambulatory Visit
Admission: RE | Admit: 2021-03-27 | Discharge: 2021-03-27 | Disposition: A | Discharge status: Home / Self Care | Payer: Medicare Other | Source: Ambulatory Visit | Attending: Ophthalmology | Admitting: Ophthalmology

## 2021-03-27 ENCOUNTER — Other Ambulatory Visit: Payer: Self-pay | Admitting: Cardiology

## 2021-03-27 DIAGNOSIS — Q231 Congenital insufficiency of aortic valve: Secondary | ICD-10-CM | POA: Diagnosis not present

## 2021-03-27 DIAGNOSIS — H53462 Homonymous bilateral field defects, left side: Secondary | ICD-10-CM | POA: Insufficient documentation

## 2021-03-27 DIAGNOSIS — C801 Malignant (primary) neoplasm, unspecified: Secondary | ICD-10-CM

## 2021-03-27 DIAGNOSIS — G936 Cerebral edema: Secondary | ICD-10-CM | POA: Diagnosis not present

## 2021-03-27 DIAGNOSIS — R519 Headache, unspecified: Secondary | ICD-10-CM | POA: Diagnosis not present

## 2021-03-27 DIAGNOSIS — Z720 Tobacco use: Secondary | ICD-10-CM

## 2021-03-27 DIAGNOSIS — R9089 Other abnormal findings on diagnostic imaging of central nervous system: Secondary | ICD-10-CM | POA: Diagnosis not present

## 2021-03-27 DIAGNOSIS — G9389 Other specified disorders of brain: Secondary | ICD-10-CM | POA: Diagnosis not present

## 2021-03-27 DIAGNOSIS — I10 Essential (primary) hypertension: Secondary | ICD-10-CM | POA: Diagnosis not present

## 2021-03-27 DIAGNOSIS — R011 Cardiac murmur, unspecified: Secondary | ICD-10-CM | POA: Diagnosis not present

## 2021-03-27 MED ORDER — GADOBUTROL 1 MMOL/ML IV SOLN
9.0000 mL | Freq: Once | INTRAVENOUS | Status: AC | PRN
  Administered 2021-03-27: 9 mL via INTRAVENOUS

## 2021-03-31 ENCOUNTER — Other Ambulatory Visit: Payer: Self-pay

## 2021-03-31 ENCOUNTER — Ambulatory Visit
Admission: RE | Admit: 2021-03-31 | Discharge: 2021-03-31 | Disposition: A | Discharge status: Home / Self Care | Payer: Medicare Other | Source: Ambulatory Visit | Attending: Internal Medicine | Admitting: Internal Medicine

## 2021-03-31 DIAGNOSIS — Z720 Tobacco use: Secondary | ICD-10-CM | POA: Insufficient documentation

## 2021-03-31 DIAGNOSIS — K5730 Diverticulosis of large intestine without perforation or abscess without bleeding: Secondary | ICD-10-CM | POA: Diagnosis not present

## 2021-03-31 DIAGNOSIS — I700 Atherosclerosis of aorta: Secondary | ICD-10-CM | POA: Insufficient documentation

## 2021-03-31 DIAGNOSIS — C7931 Secondary malignant neoplasm of brain: Secondary | ICD-10-CM | POA: Diagnosis not present

## 2021-03-31 DIAGNOSIS — R911 Solitary pulmonary nodule: Secondary | ICD-10-CM | POA: Insufficient documentation

## 2021-03-31 DIAGNOSIS — C801 Malignant (primary) neoplasm, unspecified: Secondary | ICD-10-CM | POA: Diagnosis not present

## 2021-03-31 LAB — GLUCOSE, CAPILLARY: Glucose-Capillary: 133 mg/dL — ABNORMAL HIGH (ref 70–99)

## 2021-03-31 MED ORDER — FLUDEOXYGLUCOSE F - 18 (FDG) INJECTION
10.4000 | Freq: Once | INTRAVENOUS | Status: AC | PRN
  Administered 2021-03-31: 11 via INTRAVENOUS

## 2021-04-03 ENCOUNTER — Encounter: Payer: Self-pay | Admitting: Oncology

## 2021-04-03 ENCOUNTER — Inpatient Hospital Stay: Payer: Medicare Other

## 2021-04-03 ENCOUNTER — Inpatient Hospital Stay: Payer: Medicare Other | Attending: Oncology | Admitting: Oncology

## 2021-04-03 ENCOUNTER — Other Ambulatory Visit: Payer: Self-pay

## 2021-04-03 VITALS — BP 135/78 | HR 72 | Temp 99.1°F | Resp 18 | Ht 76.0 in | Wt 199.3 lb

## 2021-04-03 DIAGNOSIS — F17210 Nicotine dependence, cigarettes, uncomplicated: Secondary | ICD-10-CM | POA: Diagnosis not present

## 2021-04-03 DIAGNOSIS — C7931 Secondary malignant neoplasm of brain: Secondary | ICD-10-CM | POA: Insufficient documentation

## 2021-04-03 DIAGNOSIS — R918 Other nonspecific abnormal finding of lung field: Secondary | ICD-10-CM | POA: Diagnosis not present

## 2021-04-03 DIAGNOSIS — Z79899 Other long term (current) drug therapy: Secondary | ICD-10-CM | POA: Insufficient documentation

## 2021-04-03 DIAGNOSIS — R519 Headache, unspecified: Secondary | ICD-10-CM | POA: Insufficient documentation

## 2021-04-03 DIAGNOSIS — Z7951 Long term (current) use of inhaled steroids: Secondary | ICD-10-CM | POA: Diagnosis not present

## 2021-04-03 DIAGNOSIS — Z7982 Long term (current) use of aspirin: Secondary | ICD-10-CM | POA: Insufficient documentation

## 2021-04-03 DIAGNOSIS — C3401 Malignant neoplasm of right main bronchus: Secondary | ICD-10-CM | POA: Diagnosis not present

## 2021-04-03 MED ORDER — DEXAMETHASONE 4 MG PO TABS: 4.0000 mg | ORAL_TABLET | Freq: Two times a day (BID) | ORAL | 0 refills | Status: DC

## 2021-04-03 NOTE — Progress Notes (Addendum)
[place]  [phone] [phone]  ID: Enedina Finner OB: Sep 18, 1956  MR#: [id number]  STM#:[id number]  Patient Care Team: Josetta Huddle, MD as PCP - General (Internal Medicine) Garlan Fair, MD as Consulting Physician (Gastroenterology) Telford Nab, RN as Oncology Nurse Navigator  CHIEF COMPLAINT: Likely stage IV lung cancer with brain metastasis.  INTERVAL HISTORY: Patient is a 65 year old male who recently presented with visual changes and headache which led to MRI of his brain.  Patient was noted to have 16 lesions consistent with metastatic disease.  Subsequent PET scan revealed right upper lobe lesion and a large right hilar mass consistent with primary lung malignancy.  He otherwise feels well.  He has no other neurologic complaints.  He denies any recent fevers or illnesses.  He has a good appetite and denies weight loss.  He has no chest pain, shortness of breath, cough, or hemoptysis.  He denies any nausea, vomiting, constipation, or diarrhea.  He has no urinary complaints.  Patient otherwise feels well and offers no further specific complaints today.  REVIEW OF SYSTEMS:   Review of Systems  Constitutional: Negative.  Negative for fever, malaise/fatigue and weight loss.  Eyes: Positive for blurred vision.  Respiratory: Negative.  Negative for cough, hemoptysis and shortness of breath.   Cardiovascular: Negative.  Negative for chest pain and leg swelling.  Gastrointestinal: Negative.  Negative for abdominal pain.  Genitourinary: Negative.  Negative for dysuria.  Musculoskeletal: Negative.  Negative for back pain.  Skin: Negative.  Negative for rash.  Neurological: Positive for headaches. Negative for focal weakness and weakness.  Psychiatric/Behavioral: Negative.  The patient is not nervous/anxious.     As per HPI. Otherwise, a complete review of systems is negative.  PAST MEDICAL HISTORY: Past Medical History:  Diagnosis Date   . Anxiety   . Arthritis   . Dysplastic nevus 12/30/2020   R post waistline - moderate  . GERD (gastroesophageal reflux disease)   . Heart murmur   . Hepatitis C    dx'd 1980s.  treated with Harvoni approx 2015  . Hypertension   . Squamous cell carcinoma of skin 12/18/2019   SCCIS L lat ankle  . Squamous cell carcinoma of skin 11/07/2008   SCCIS L wrist    PAST SURGICAL HISTORY: Past Surgical History:  Procedure Laterality Date  . AORTIC VALVE REPLACEMENT  05/2001  . CATARACT EXTRACTION W/PHACO Right 12/17/2020   Procedure: CATARACT EXTRACTION PHACO AND INTRAOCULAR LENS PLACEMENT (IOC) RIGHT 6.67 00:49.5;  Surgeon: Birder Robson, MD;  Location: [facility];  Service: Ophthalmology;  Laterality: Right;  . COLONOSCOPY WITH PROPOFOL N/A 01/04/2017   Procedure: COLONOSCOPY WITH PROPOFOL;  Surgeon: Garlan Fair, MD;  Location: [facility];  Service: Endoscopy;  Laterality: N/A;    FAMILY HISTORY: Family History  Problem Relation Age of Onset  . Cancer Mother   . Heart disease Father     ADVANCED DIRECTIVES (Y/N):  N  HEALTH MAINTENANCE: Social History   Tobacco Use  . Smoking status: Current Every Day Smoker    Packs/day: 0.50    Types: Cigarettes    Start date: 11/16/1978  . Smokeless tobacco: Never Used  Vaping Use  . Vaping Use: Never used  Substance Use Topics  . Alcohol use: Yes    Alcohol/week: 3.0 standard drinks    Types: 3 Standard drinks or equivalent per week    Comment: occasional  . Drug use: No     Colonoscopy:  PAP:  Bone density:  Lipid panel:  Allergies  Allergen Reactions  . Avelox [Moxifloxacin]     dizziness  . Lexapro [Escitalopram Oxalate]   . Rifampin   . Wellbutrin [Bupropion]     Anxiety/depression     Current Outpatient Medications  Medication Sig Dispense Refill  . Alpha-Lipoic Acid 100 MG CAPS Take 200 mg by mouth daily.    Marland Kitchen ALPRAZolam (XANAX) 0.5 MG tablet Take 0.5 mg by mouth at bedtime as needed for  anxiety.    . Arginine 1000 MG TABS Take by mouth daily.    Marland Kitchen aspirin 81 MG tablet Take 81 mg by mouth daily.    Marland Kitchen BIOTIN PO Take 1 tablet by mouth daily.    . cholecalciferol (VITAMIN D) 1000 UNITS tablet Take 5,000 Units by mouth daily.    Marland Kitchen CINNAMON PO Take 1,000 mg by mouth daily.    . Coenzyme Q10 (COQ10) 100 MG CAPS Take 100 mg by mouth daily.    . Cyanocobalamin (B-12) 2500 MCG SUBL Place 1 tablet under the tongue 2 (two) times daily.    . cyclobenzaprine (FLEXERIL) 10 MG tablet Take 10 mg by mouth 3 (three) times daily as needed for muscle spasms.    Marland Kitchen dicyclomine (BENTYL) 20 MG tablet Take 40 mg by mouth daily as needed for spasms.    . diphenhydrAMINE (BENADRYL) 25 MG tablet Take 25 mg by mouth every 6 (six) hours as needed.    . diphenoxylate-atropine (LOMOTIL) 2.5-0.025 MG per tablet Take 1 tablet by mouth 4 (four) times daily as needed for diarrhea or loose stools.    . fexofenadine (ALLEGRA) 180 MG tablet Take 180 mg by mouth daily.    . Ginkgo Biloba (GINKOBA PO) Take 120 mg by mouth in the morning and at bedtime.    . GuaiFENesin (MUCINEX PO) Take 1 tablet by mouth daily as needed.    Marland Kitchen losartan (COZAAR) 50 MG tablet Take 50 mg by mouth daily.    . melatonin 5 MG TABS Take 5 mg by mouth at bedtime as needed.    . metoprolol (LOPRESSOR) 50 MG tablet Take 50 mg by mouth 2 (two) times daily.    . milk thistle 175 MG tablet Take 175 mg by mouth daily.     . Multiple Vitamin (MULTIVITAMIN WITH MINERALS) TABS tablet Take 1 tablet by mouth daily.    . multivitamin-lutein (OCUVITE-LUTEIN) CAPS capsule Take 1 capsule by mouth daily.    . mupirocin ointment (BACTROBAN) 2 % Apply 1 application topically daily. 22 g 4  . naproxen sodium (ALEVE) 220 MG tablet Take 220 mg by mouth daily as needed.    . Omega-3 Fatty Acids (FISH OIL) 1000 MG CAPS Take 1 capsule by mouth 2 (two) times daily.     Marland Kitchen omeprazole (PRILOSEC) 20 MG capsule Take 20 mg by mouth daily.    Marland Kitchen oxyCODONE (OXY  IR/ROXICODONE) 5 MG immediate release tablet Take 10 mg by mouth 2 (two) times daily as needed for severe pain. Per patient he takes it 4-5 times per month    . pregabalin (LYRICA) 50 MG capsule Take 50 mg by mouth daily as needed (pain).    . Riboflavin (B-2) 50 MG TABS Take 1 tablet by mouth daily.    . Saw Palmetto 450 MG CAPS Take by mouth daily.    . Selenium (SELENIMIN-200 PO) Take 200 mg by mouth daily.    . simethicone (MYLICON) 80 MG chewable tablet Chew 80 mg by mouth daily.    Marland Kitchen  simvastatin (ZOCOR) 20 MG tablet Take 20 mg by mouth daily at 6 PM.     . Testosterone 40.5 MG/2.5GM (1.62%) GEL Place 1 application onto the skin every other day.     . Turmeric 450 MG CAPS Take by mouth daily.    . vitamin C (ASCORBIC ACID) 500 MG tablet Take 500 mg by mouth 2 (two) times daily.    . vitamin E 200 UNIT capsule Take 200 Units by mouth daily.    . VOLTAREN 1 % GEL Apply 2 g topically 2 (two) times daily as needed (pain).   3  . zinc gluconate 50 MG tablet Take 50 mg by mouth daily.    Marland Kitchen zolpidem (AMBIEN) 5 MG tablet Take 5 mg by mouth at bedtime as needed for sleep.    . mometasone (NASONEX) 50 MCG/ACT nasal spray INHALE 2 PUFFS IN EACH NOSTRIL DAILY. (Patient not taking: Reported on 04/03/2021)    . sildenafil (REVATIO) 20 MG tablet Take 20 mg by mouth as needed (ED). (Patient not taking: No sig reported)     No current facility-administered medications for this visit.    OBJECTIVE: Vitals:   04/03/21 1335  BP: 135/78  Pulse: 72  Resp: 18  Temp: 99.1 F (37.3 C)  SpO2: 97%     Body mass index is 24.26 kg/m.    ECOG FS:1 - Symptomatic but completely ambulatory  General: Well-developed, well-nourished, no acute distress. Eyes: Pink conjunctiva, anicteric sclera. HEENT: Normocephalic, moist mucous membranes. Lungs: No audible wheezing or coughing. Heart: Regular rate and rhythm. Abdomen: Soft, nontender, no obvious distention. Musculoskeletal: No edema, cyanosis, or  clubbing. Neuro: Alert, answering all questions appropriately. Cranial nerves grossly intact. Skin: No rashes or petechiae noted. Psych: Normal affect. Lymphatics: No cervical, calvicular, axillary or inguinal LAD.   LAB RESULTS:  Lab Results  Component Value Date   NA 139 08/02/2014   K 5.3 08/02/2014   CL 103 08/02/2014   CO2 27 08/02/2014   GLUCOSE 113 (H) 08/02/2014   BUN 12 08/02/2014   CREATININE 0.70 04/27/2019   CALCIUM 9.5 08/02/2014   PROT 7.5 08/02/2014   ALBUMIN 4.0 08/02/2014   AST 29 08/02/2014   ALT 34 08/02/2014   ALKPHOS 57 08/02/2014   BILITOT 0.6 08/02/2014   GFRNONAA >89 08/02/2014   GFRAA >89 08/02/2014    Lab Results  Component Value Date   WBC 11.1 (H) 08/02/2014   NEUTROABS 5.3 08/02/2014   HGB 16.9 08/02/2014   HCT 47.9 08/02/2014   MCV 88.1 08/02/2014   PLT 246 08/02/2014     STUDIES: MR BRAIN W WO CONTRAST  Result Date: 03/27/2021 CLINICAL DATA:  Left homonymous hemianopsia. Headache. Duration of symptoms 6 weeks. EXAM: MRI HEAD WITHOUT AND WITH CONTRAST TECHNIQUE: Multiplanar, multiecho pulse sequences of the brain and surrounding structures were obtained without and with intravenous contrast. CONTRAST:  62mL GADAVIST GADOBUTROL 1 MMOL/ML IV SOLN COMPARISON:  None. FINDINGS: Brain: No sign of acute ischemic infarction. No abnormality affects the brainstem, within the superomedial right cerebellum, there is a 4 mm ring-enhancing lesion axial image 64 consistent with a metastasis. Within the right occipital lobe, there is a necrotic metastasis measuring up to 4.2 cm in diameter with pronounced regional vasogenic edema and mass effect. Broad surface along the superior surface of the tentorium in the posteromedial falx. No evidence of venous invasion of either the torcula or the straight sinus. 6 mm metastasis in the left basal ganglia axial image 89. 5 mm metastasis  medial right parietal lobe axial image 94. 9 mm metastasis lateral posterior frontal  region on the left axial image 103. 5 mm metastasis right parietal region axial image 111. 15 mm metastasis left posterior frontal lobe axial image 113. A mm metastasis right lateral parietal lobe axial image 116. 3 mm metastasis medial left parietal lobe axial image 120. 3 mm metastasis medial left parietal lobe axial image 121. 3 mm metastasis right frontal lobe axial image 123. 8 mm metastasis left lateral posterior frontal region axial image 124. 2 mm metastasis left posterior frontal lobe axial image 125. 3 mm metastasis right frontoparietal junction axial image 129. 3 mm metastasis right posterior frontal vertex axial image 139. 2 mm metastasis left posterior frontal vertex axial image 139. This as up to 16 discrete metastatic lesions. Vasogenic edema and mass effect or most pronounced in the right occipital and parietal region but also present within the left posterior frontal brain. No hydrocephalus. No extra-axial collection. No skull or skull base metastasis. Vascular: Major vessels at the base of the brain show flow. Skull and upper cervical spine: Negative Sinuses/Orbits: Clear except for a retention cyst of the anterior inferior left maxillary sinus. Orbits negative. Other: None IMPRESSION: Total of 16 metastases, 15 supra tentorial and 1 cerebellar. The largest lesion is in the right occipital lobe measuring up to 4.2 cm in diameter, with extensive regional vasogenic edema. This is likely responsible for the visual symptoms. Mass-effect because of that lesion and the associated edema. Vasogenic edema also present in the left posterior frontal lobe associated with the 15 mm metastasis in that region. Many the metastases show necrosis, more notable with increasing lesion size. These results will be called to the ordering clinician or representative by the Radiologist Assistant, and communication documented in the PACS or [employer]. Electronically Signed   By: Nelson Chimes M.D.   On: 03/27/2021  13:04   NM PET Image Initial (PI) Skull Base To Thigh  Result Date: 04/01/2021 CLINICAL DATA:  Initial treatment strategy for unknown primary. EXAM: NUCLEAR MEDICINE PET SKULL BASE TO THIGH TECHNIQUE: 11.0 mCi F-18 FDG was injected intravenously. Full-ring PET imaging was performed from the skull base to thigh after the radiotracer. CT data was obtained and used for attenuation correction and anatomic localization. Fasting blood glucose: 133 mg/dl COMPARISON:  None. FINDINGS: Mediastinal blood pool activity: SUV max 2.4 Liver activity: SUV max NA NECK: No hypermetabolic lymph nodes in the neck. Peripherally metabolic lesion in the RIGHT occipital lobe with a rim of hypermetabolism consistent with brain metastasis with associated vasogenic edema. (Image 4) Incidental CT findings: none CHEST: 15 mm hypermetabolic nodule in the RIGHT lung apex with SUV max equal 5.3 on image 87. Hypermetabolic RIGHT suprahilar mass measures 3.8 by 3.3 cm and has intense metabolic activity with SUV max equal 9.7. Mass extends into the RIGHT lower paratracheal nodal station. No hypermetabolic contralateral lymph nodes. No hypermetabolic supraclavicular nodes. Incidental CT findings: none ABDOMEN/PELVIS: No abnormal hypermetabolic activity within the liver, pancreas, adrenal glands, or spleen. No hypermetabolic lymph nodes in the abdomen or pelvis. Incidental CT findings: LEFT colon diverticulosis. Atherosclerotic calcification of the aorta. Peripherally calcified LEFT testicle. SKELETON: No focal hypermetabolic activity to suggest skeletal metastasis. Incidental CT findings: none IMPRESSION: 1. Hypermetabolic pulmonary nodule in the medial aspect of the RIGHT lung apex most consistent with primary bronchogenic carcinoma. 2. Metastatic nodal mass to the RIGHT suprahilar nodal station with extension into the RIGHT lower paratracheal nodal station. 3. Brain metastasis identified in  the RIGHT occipital lobe. See recent MRI.  Electronically Signed   By: Suzy Bouchard M.D.   On: 04/01/2021 12:42    ASSESSMENT: Likely stage IV lung cancer with brain metastasis.  PLAN:    1.  Likely stage IV lung cancer with brain metastasis: MRI of the brain from Mar 27, 2021 reviewed independently and reported as above with a total of 16 lesions consistent with metastatic disease.  Subsequent PET scan on Mar 31, 2021 revealed a hypermetabolic pulmonary nodule in the right apex along with a large right suprahilar nodal mass.  Referral has been sent to radiation oncology for treatment of brain metastasis.  Patient will also require bronchoscopy/EBUS for biopsy to confirm diagnosis.  Finally, patient will likely require chemotherapy and referral was sent for port placement.  Return to clinic 3 to 4 days after biopsy to discuss the results and treatment planning. 2.  Brain metastasis: Radiation oncology referral as above.  Patient was also placed on Decadron today.  I spent a total of 60 minutes reviewing chart data, face-to-face evaluation with the patient, counseling and coordination of care as detailed above.   Patient expressed understanding and was in agreement with this plan. He also understands that He can call clinic at any time with any questions, concerns, or complaints.   Cancer Staging No matching staging information was found for the patient.  Lloyd Huger, MD   04/03/2021 2:28 PM

## 2021-04-04 ENCOUNTER — Telehealth: Payer: Self-pay | Admitting: *Deleted

## 2021-04-04 NOTE — Telephone Encounter (Signed)
Call placed to patient to review new medication that was sent to pharmacy. Dexamethasone 4 mg BID sent to patients pharmacy by Dr. Grayland Ormond, patient advised and verbalized understanding of need for medication. Patient also updated with appt details for port placement. Patient is scheduled for IR port placement on 5/27 at 9:30, patient to arrive at 8:30 for procedure. IR nurse will reach out to patient prior to procedure also. Patient and wife verbalized understanding of appt details.   Referral faxed to Dr. [name], Dr. Grayland Ormond also reached out to provider. Appt with pulmonary pending at this time. Patient is aware that Dr. Teodoro Kil office will reach out with appt.

## 2021-04-07 DIAGNOSIS — R911 Solitary pulmonary nodule: Secondary | ICD-10-CM | POA: Diagnosis not present

## 2021-04-08 ENCOUNTER — Encounter: Payer: Self-pay | Admitting: *Deleted

## 2021-04-08 ENCOUNTER — Other Ambulatory Visit: Payer: Self-pay

## 2021-04-08 ENCOUNTER — Ambulatory Visit
Admission: RE | Admit: 2021-04-08 | Discharge: 2021-04-08 | Disposition: A | Discharge status: Home / Self Care | Payer: Medicare Other | Source: Ambulatory Visit | Attending: Radiation Oncology | Admitting: Radiation Oncology

## 2021-04-08 ENCOUNTER — Other Ambulatory Visit: Payer: Self-pay | Admitting: *Deleted

## 2021-04-08 VITALS — BP 133/77 | HR 65 | Temp 96.8°F | Resp 16 | Wt 201.2 lb

## 2021-04-08 DIAGNOSIS — Z85828 Personal history of other malignant neoplasm of skin: Secondary | ICD-10-CM | POA: Insufficient documentation

## 2021-04-08 DIAGNOSIS — B1920 Unspecified viral hepatitis C without hepatic coma: Secondary | ICD-10-CM | POA: Diagnosis not present

## 2021-04-08 DIAGNOSIS — Z7982 Long term (current) use of aspirin: Secondary | ICD-10-CM | POA: Diagnosis not present

## 2021-04-08 DIAGNOSIS — F17210 Nicotine dependence, cigarettes, uncomplicated: Secondary | ICD-10-CM | POA: Insufficient documentation

## 2021-04-08 DIAGNOSIS — C771 Secondary and unspecified malignant neoplasm of intrathoracic lymph nodes: Secondary | ICD-10-CM | POA: Diagnosis not present

## 2021-04-08 DIAGNOSIS — R011 Cardiac murmur, unspecified: Secondary | ICD-10-CM | POA: Diagnosis not present

## 2021-04-08 DIAGNOSIS — K219 Gastro-esophageal reflux disease without esophagitis: Secondary | ICD-10-CM | POA: Insufficient documentation

## 2021-04-08 DIAGNOSIS — C7931 Secondary malignant neoplasm of brain: Secondary | ICD-10-CM

## 2021-04-08 DIAGNOSIS — C3402 Malignant neoplasm of left main bronchus: Secondary | ICD-10-CM | POA: Insufficient documentation

## 2021-04-08 DIAGNOSIS — I10 Essential (primary) hypertension: Secondary | ICD-10-CM | POA: Diagnosis not present

## 2021-04-08 DIAGNOSIS — Z79899 Other long term (current) drug therapy: Secondary | ICD-10-CM | POA: Diagnosis not present

## 2021-04-08 DIAGNOSIS — M129 Arthropathy, unspecified: Secondary | ICD-10-CM | POA: Diagnosis not present

## 2021-04-08 DIAGNOSIS — Z809 Family history of malignant neoplasm, unspecified: Secondary | ICD-10-CM | POA: Insufficient documentation

## 2021-04-08 NOTE — Consult Note (Signed)
NEW PATIENT EVALUATION  Name: Matthew Kelly  MRN: [id number]  Date:   04/08/2021     DOB: 1956/10/22   This 65 y.o. 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 Cancer    Initital consultation    DIAGNOSIS: The encounter diagnosis was Brain metastasis ([place]).   PREVIOUS INVESTIGATIONS:  PET scan CT scans and MRI brain reviewed Clinical notes reviewed Pathology pending  HPI: Patient is a 65 year old male who started having some subtle visual changes and confusion while driving.  He had recently had some cataract surgery in his right orbit.  Patient is also having some intermittent headaches.  MRI scan of his brain was performed showing widespread metastatic disease to the brain brain with at least 16 metastatic sites that have a large lesion up to 4.2 cm in the right occipital lobe measuring up to 4.2 cm with vasogenic edema.  Follow-up CT scan as well as PET scan showed a hypermetabolic pulmonary nodule the medial aspect the right lobe lung apex consistent with primary bronchogenic carcinoma there is also metastatic nodal mass to the right suprahilar nodal station with extension to the right lower paratracheal nodal station.  Except for the subtle visual changes and headaches he is having very little symptoms specifically denies any focal neurologic deficits cough hemoptysis or chest tightness.  He has no evidence of SVC with no evidence of venous jugular distention or facial plethora.  He has been scheduled and seen by pulmonology for bronchoscopy for tissue diagnosis.  He is seen today for consideration of palliative radiotherapy to his whole brain.  PLANNED TREATMENT REGIMEN: Whole brain radiation therapy  PAST MEDICAL HISTORY:  has a past medical history of Anxiety, Arthritis, Dysplastic nevus (12/30/2020),  GERD (gastroesophageal reflux disease), Heart murmur, Hepatitis C, Hypertension, Squamous cell carcinoma of skin (12/18/2019), and Squamous cell carcinoma of skin (11/07/2008).    PAST SURGICAL HISTORY:  Past Surgical History:  Procedure Laterality Date  . AORTIC VALVE REPLACEMENT  05/2001  . CATARACT EXTRACTION W/PHACO Right 12/17/2020   Procedure: CATARACT EXTRACTION PHACO AND INTRAOCULAR LENS PLACEMENT (IOC) RIGHT 6.67 00:49.5;  Surgeon: Birder Robson, MD;  Location: [facility];  Service: Ophthalmology;  Laterality: Right;  . COLONOSCOPY WITH PROPOFOL N/A 01/04/2017   Procedure: COLONOSCOPY WITH PROPOFOL;  Surgeon: Garlan Fair, MD;  Location: [facility];  Service: Endoscopy;  Laterality: N/A;    FAMILY HISTORY: family history includes Cancer in his mother; Heart disease in his father.  SOCIAL HISTORY:  reports that he has been smoking cigarettes. He started smoking about 42 years ago. He has been smoking about 0.50 packs per day. He has never used smokeless tobacco. He reports current alcohol use of about 3.0 standard drinks of alcohol per week. He reports that he does not use drugs.  ALLERGIES: Avelox [moxifloxacin], Lexapro [escitalopram oxalate], Rifampin, and Wellbutrin [bupropion]  MEDICATIONS:  Current Outpatient Medications  Medication Sig Dispense Refill  . Alpha-Lipoic Acid 100 MG CAPS Take 200 mg by mouth daily.    Marland Kitchen ALPRAZolam (XANAX) 0.5 MG tablet Take 0.5 mg by mouth at bedtime as needed for anxiety.    . Arginine 1000 MG TABS Take by mouth daily.    Marland Kitchen aspirin 81 MG tablet Take 81 mg by mouth daily.    Marland Kitchen BIOTIN PO Take 1 tablet by mouth daily.    Marland Kitchen  chlorhexidine (PERIDEX) 0.12 % solution SMARTSIG:0.5 Ounce(s) By Mouth Twice Daily    . cholecalciferol (VITAMIN D) 1000 UNITS tablet Take 5,000 Units by mouth daily.    Marland Kitchen CINNAMON PO Take 1,000 mg by mouth daily.    . Coenzyme Q10 (COQ10) 100 MG CAPS Take 100 mg by mouth daily.    . Cyanocobalamin (B-12) 2500  MCG SUBL Place 1 tablet under the tongue 2 (two) times daily.    . cyclobenzaprine (FLEXERIL) 10 MG tablet Take 10 mg by mouth 3 (three) times daily as needed for muscle spasms.    Marland Kitchen dexamethasone (DECADRON) 4 MG tablet Take 1 tablet (4 mg total) by mouth 2 (two) times daily with a meal. 60 tablet 0  . dicyclomine (BENTYL) 20 MG tablet Take 40 mg by mouth daily as needed for spasms.    . diphenhydrAMINE (BENADRYL) 25 MG tablet Take 25 mg by mouth every 6 (six) hours as needed.    . diphenoxylate-atropine (LOMOTIL) 2.5-0.025 MG per tablet Take 1 tablet by mouth 4 (four) times daily as needed for diarrhea or loose stools.    . fexofenadine (ALLEGRA) 180 MG tablet Take 180 mg by mouth daily.    . Ginkgo Biloba (GINKOBA PO) Take 120 mg by mouth in the morning and at bedtime.    . GuaiFENesin (MUCINEX PO) Take 1 tablet by mouth daily as needed.    . hydrocortisone (ANUSOL-HC) 2.5 % rectal cream Apply topically 2 (two) times daily as needed.    Marland Kitchen losartan (COZAAR) 100 MG tablet Take 1 tablet by mouth daily.    Marland Kitchen losartan (COZAAR) 50 MG tablet Take 50 mg by mouth daily.    . melatonin 5 MG TABS Take 5 mg by mouth at bedtime as needed.    . metoprolol (LOPRESSOR) 50 MG tablet Take 50 mg by mouth 2 (two) times daily.    . milk thistle 175 MG tablet Take 175 mg by mouth daily.     . Multiple Vitamin (MULTIVITAMIN WITH MINERALS) TABS tablet Take 1 tablet by mouth daily.    . multivitamin-lutein (OCUVITE-LUTEIN) CAPS capsule Take 1 capsule by mouth daily.    . mupirocin ointment (BACTROBAN) 2 % Apply 1 application topically daily. 22 g 4  . naproxen sodium (ALEVE) 220 MG tablet Take 220 mg by mouth daily as needed.    . Omega-3 Fatty Acids (FISH OIL) 1000 MG CAPS Take 1 capsule by mouth 2 (two) times daily.     Marland Kitchen omeprazole (PRILOSEC) 20 MG capsule Take 20 mg by mouth daily.    Marland Kitchen oxyCODONE (OXY IR/ROXICODONE) 5 MG immediate release tablet Take 10 mg by mouth 2 (two) times daily as needed for severe pain.  Per patient he takes it 4-5 times per month    . pregabalin (LYRICA) 50 MG capsule Take 50 mg by mouth daily as needed (pain).    . Riboflavin (B-2) 50 MG TABS Take 1 tablet by mouth daily.    . Saw Palmetto 450 MG CAPS Take by mouth daily.    . Selenium (SELENIMIN-200 PO) Take 200 mg by mouth daily.    . simethicone (MYLICON) 80 MG chewable tablet Chew 80 mg by mouth daily.    . simvastatin (ZOCOR) 20 MG tablet Take 20 mg by mouth daily at 6 PM.     . Testosterone 40.5 MG/2.5GM (1.62%) GEL Place 1 application onto the skin every other day.     . Turmeric 450 MG CAPS Take by mouth daily.    Marland Kitchen  vitamin C (ASCORBIC ACID) 500 MG tablet Take 500 mg by mouth 2 (two) times daily.    . vitamin E 200 UNIT capsule Take 200 Units by mouth daily.    . VOLTAREN 1 % GEL Apply 2 g topically 2 (two) times daily as needed (pain).   3  . zinc gluconate 50 MG tablet Take 50 mg by mouth daily.    Marland Kitchen zolpidem (AMBIEN) 10 MG tablet Take 10 mg by mouth at bedtime as needed.    . mometasone (NASONEX) 50 MCG/ACT nasal spray INHALE 2 PUFFS IN EACH NOSTRIL DAILY. (Patient not taking: No sig reported)    . sildenafil (REVATIO) 20 MG tablet Take 20 mg by mouth as needed (ED). (Patient not taking: No sig reported)     No current facility-administered medications for this encounter.    ECOG PERFORMANCE STATUS:  1 - Symptomatic but completely ambulatory  REVIEW OF SYSTEMS: Patient denies any weight loss, fatigue, weakness, fever, chills or night sweats. Patient denies any loss of vision, blurred vision. Patient denies any ringing  of the ears or hearing loss. No irregular heartbeat. Patient denies heart murmur or history of fainting. Patient denies any chest pain or pain radiating to her upper extremities. Patient denies any shortness of breath, difficulty breathing at night, cough or hemoptysis. Patient denies any swelling in the lower legs. Patient denies any nausea vomiting, vomiting of blood, or coffee ground material in  the vomitus. Patient denies any stomach pain. Patient states has had normal bowel movements no significant constipation or diarrhea. Patient denies any dysuria, hematuria or significant nocturia. Patient denies any problems walking, swelling in the joints or loss of balance. Patient denies any skin changes, loss of hair or loss of weight. Patient denies any excessive worrying or anxiety or significant depression. Patient denies any problems with insomnia. Patient denies excessive thirst, polyuria, polydipsia. Patient denies any swollen glands, patient denies easy bruising or easy bleeding. Patient denies any recent infections, allergies or URI. Patient "s visual fields have not changed significantly in recent time.   PHYSICAL EXAM: BP 133/77 (BP Location: Left Arm, Patient Position: Sitting)   Pulse 65   Temp (!) 96.8 F (36 C) (Tympanic)   Resp 16   Wt 201 lb 3.2 oz (91.3 kg)   BMI 24.49 kg/m  Crude visual fields are within normal range.  Motor or sensory in detail levels are equal and symmetric in the upper and lower extremities.  Well-developed well-nourished patient in NAD. HEENT reveals PERLA, EOMI, discs not visualized.  Oral cavity is clear. No oral mucosal lesions are identified. Neck is clear without evidence of cervical or supraclavicular adenopathy. Lungs are clear to A&P. Cardiac examination is essentially unremarkable with regular rate and rhythm without murmur rub or thrill. Abdomen is benign with no organomegaly or masses noted. Motor sensory and DTR levels are equal and symmetric in the upper and lower extremities. Cranial nerves II through XII are grossly intact. Proprioception is intact. No peripheral adenopathy or edema is identified. No motor or sensory levels are noted. Crude visual fields are within normal range.  LABORATORY DATA: Pathology is pending    RADIOLOGY RESULTS: MRI of brain CT scan PET CT scan of the chest is reviewed compatible with above-stated  findings   IMPRESSION: Stage IV probable small cell lung cancer with brain metastasis and 65 year old male  PLAN: At this time I have recommended whole brain radiation.  I would plan on delivering 3000 centigrade 15 fractions.  Would also  boost the large right occipital lesion another 20 Gray in 4 fractions using IMRT treatment planning and delivery.  Risks and benefits of treatment occluding hair loss skin reaction fatigue all were discussed in detail with the patient and his wife.  He is currently on Decadron.  I have set up simulation for tomorrow.  We will also keep an eye on his chest since he is at high risk for SVC based on the tumor location.  After tissue diagnosis is obtained Dr. Grayland Ormond will start systemic treatment.  I would like to take this opportunity to thank you for allowing me to participate in the care of your patient.Noreene Filbert, MD

## 2021-04-08 NOTE — Progress Notes (Signed)
Met with patient during initial consult with Dr. Baruch Gouty to discuss brain radiation. All questions answered during visit. Pt informed that will have follow up with Dr. Grayland Ormond about 1 week after his bronchoscopy to discuss results and further treatment planning. Contact info given and instructed to call with any further questions or needs. Pt verbalized understanding.

## 2021-04-09 ENCOUNTER — Other Ambulatory Visit: Payer: Self-pay | Admitting: *Deleted

## 2021-04-09 ENCOUNTER — Ambulatory Visit: Payer: Medicare Other | Attending: Radiation Oncology

## 2021-04-09 DIAGNOSIS — C7931 Secondary malignant neoplasm of brain: Secondary | ICD-10-CM | POA: Diagnosis not present

## 2021-04-09 DIAGNOSIS — R918 Other nonspecific abnormal finding of lung field: Secondary | ICD-10-CM | POA: Insufficient documentation

## 2021-04-09 DIAGNOSIS — C3402 Malignant neoplasm of left main bronchus: Secondary | ICD-10-CM | POA: Diagnosis not present

## 2021-04-09 MED ORDER — DEXAMETHASONE 4 MG PO TABS: 4.0000 mg | ORAL_TABLET | Freq: Two times a day (BID) | ORAL | 1 refills | Status: DC

## 2021-04-10 ENCOUNTER — Other Ambulatory Visit: Payer: Self-pay | Admitting: Radiology

## 2021-04-10 ENCOUNTER — Encounter: Payer: Self-pay | Admitting: *Deleted

## 2021-04-10 NOTE — Progress Notes (Signed)
Patient on schedule for Port placement 04/11/2021, called and spoke with patient on phone with pre procedure instructions given. Made aware to be here @ 0830, NPO after MN tonight as well as driver post procedure/recovery/discharge. Stated understanding.

## 2021-04-10 NOTE — Progress Notes (Signed)
Appt review. Pt scheduled for bronchoscopy with Dr. [name] on 6/3. WBRT schedule to start on 6/2. Will follow up with patient after bronchoscopy. Pt has my contact info if has any questions or needs. Nothing further needed at this time.

## 2021-04-10 NOTE — H&P (Signed)
Chief Complaint: Patient was seen in consultation today for right upper lobe lesion, right hilar mass, brain metastases  Referring Physician(s): Finnegan,Timothy J  Supervising Physician: Jacqulynn Cadet  Patient Status: [facility] - Out-pt  History of Present Illness: Matthew Kelly is a 65 y.o. male with past medical history of anxiety, GERD, Hep C, HTN, cirrhosis, aortic valve replacement, squamous cell skin cancer who was recently evaluated for vision changes and headaches found to have brain metastases of unknown primary.  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 Patient referred to IR for Port-A-Cath placement for anticipated chemoradation therapy at the request of Dr. Grayland Ormond.   Matthew Kelly presents today in his usual state of health.  States he has plans for and was recently fitted for brain radiation, but no definite plans for chest radiation at this time. He has otherwise been in his usual state of health with no fever, chills, cough, shortness of breath, nausea, vomiting, abdominal pain, dysuria. His wife is his ride home today.   Past Medical History:  Diagnosis Date  . Anxiety   . Arthritis   . Dysplastic nevus 12/30/2020   R post waistline - moderate  . GERD (gastroesophageal reflux disease)   . Heart murmur   . Hepatitis C    dx'd 1980s.  treated with Harvoni approx 2015  . Hypertension   . Squamous cell carcinoma of skin 12/18/2019   SCCIS L lat ankle  . Squamous cell carcinoma of skin 11/07/2008   SCCIS L wrist    Past Surgical History:  Procedure Laterality Date  . AORTIC VALVE REPLACEMENT  05/2001  . CATARACT EXTRACTION W/PHACO Right 12/17/2020   Procedure: CATARACT EXTRACTION PHACO AND INTRAOCULAR LENS PLACEMENT (IOC) RIGHT 6.67 00:49.5;  Surgeon: Birder Robson, MD;  Location: [facility];  Service: Ophthalmology;  Laterality: Right;  . COLONOSCOPY WITH PROPOFOL N/A 01/04/2017   Procedure: COLONOSCOPY  WITH PROPOFOL;  Surgeon: Garlan Fair, MD;  Location: [facility];  Service: Endoscopy;  Laterality: N/A;    Allergies: Avelox [moxifloxacin], Lexapro [escitalopram oxalate], Rifampin, and Wellbutrin [bupropion]  Medications: Prior to Admission medications   Medication Sig Start Date End Date Taking? Authorizing Provider  Acetylcysteine (NAC) 600 MG CAPS Take 600 mg by mouth in the morning.    [provider]  Alpha Lipoic Acid 200 MG CAPS Take 200 mg by mouth daily.    [provider]  ALPRAZolam Duanne Moron) 0.5 MG tablet Take 0.5 mg by mouth daily as needed for anxiety.    [provider]  Arginine (L-ARGININE-500) 500 MG CAPS Take 500 mg by mouth daily as needed (energy).    [provider]  aspirin EC 81 MG tablet Take 81 mg by mouth in the morning. Swallow whole.    [provider]  B Complex-C (B-COMPLEX WITH VITAMIN C) tablet Take 1 tablet by mouth daily.    [provider]  Biotin 1000 MCG tablet Take 1,000 mcg by mouth daily.    [provider]  Cholecalciferol (VITAMIN D3) 125 MCG (5000 UT) TABS Take 5,000 Units by mouth daily.    [provider]  CINNAMON PO Take 6,000 mg by mouth in the morning. Omega    [provider]  Coenzyme Q10 (COQ10) 100 MG CAPS Take 100 mg by mouth daily.    [provider]  Cyanocobalamin (VITAMIN B-12) 3000 MCG SUBL Place 3,000 mcg under the tongue daily.    [provider]  cyclobenzaprine (  FLEXERIL) 10 MG tablet Take 10 mg by mouth 3 (three) times daily as needed for muscle spasms.    [provider]  dexamethasone (DECADRON) 4 MG tablet Take 1 tablet (4 mg total) by mouth 2 (two) times daily with a meal. 04/09/21   Chrystal, Eulas Post, MD  Dextran 70-Hypromellose, PF, (TEARS NATURALE FREE) 0.1-0.3 % SOLN Place 1-2 drops into both eyes 3 (three) times daily as needed (dry/irritated eyes.).    [provider]  dicyclomine (BENTYL)  20 MG tablet Take 20-40 mg by mouth daily as needed for spasms.    [provider]  diphenhydrAMINE HCl (ZZZQUIL) 50 MG/30ML LIQD Take 15-30 mLs by mouth at bedtime as needed (sleep).    [provider]  diphenoxylate-atropine (LOMOTIL) 2.5-0.025 MG per tablet Take 1 tablet by mouth 4 (four) times daily as needed for diarrhea or loose stools.    [provider]  fexofenadine (ALLEGRA) 180 MG tablet Take 180 mg by mouth in the morning.    [provider]  Ginkgo Biloba 120 MG TABS Take 120 mg by mouth in the morning.    [provider]  guaifenesin (HUMIBID E) 400 MG TABS tablet Take 400 mg by mouth 2 (two) times daily as needed (congestion/allergies).    [provider]  hydrocortisone (ANUSOL-HC) 2.5 % rectal cream Apply topically 2 (two) times daily as needed for hemorrhoids or anal itching. 03/22/21   [provider]  losartan (COZAAR) 100 MG tablet Take 100 mg by mouth in the morning. 02/25/21   [provider]  Lutein-Zeaxanthin 20-1 MG CAPS Take 1 capsule by mouth in the morning.    [provider]  melatonin 5 MG TABS Take 5 mg by mouth at bedtime as needed (sleep).    [provider]  metoprolol (LOPRESSOR) 50 MG tablet Take 50 mg by mouth 2 (two) times daily.    [provider]  milk thistle 175 MG tablet Take 175 mg by mouth in the morning.    [provider]  Multiple Vitamin (MULTIVITAMIN WITH MINERALS) TABS tablet Take 1 tablet by mouth daily. Men's 50+ Multivitamin    [provider]  mupirocin ointment (BACTROBAN) 2 % Apply 1 application topically daily. 02/07/20   Ralene Bathe, MD  Naphazoline-Pheniramine (OPCON-A) 0.027-0.315 % SOLN Place 1 drop into both eyes 3 (three) times daily as needed (dry/irritated eyes.).    [provider]  naproxen sodium (ALEVE) 220 MG tablet Take 220 mg by mouth daily as needed (pain).    [provider]  Omega-3 Fatty  Acids (FISH OIL) 1200 MG CAPS Take 1,200 mg by mouth daily.    [provider]  omeprazole (PRILOSEC) 20 MG capsule Take 20 mg by mouth in the morning.    [provider]  oxyCODONE (OXY IR/ROXICODONE) 5 MG immediate release tablet Take 10 mg by mouth 2 (two) times daily as needed for severe pain (spinal stenosis pain.).    [provider]  pregabalin (LYRICA) 50 MG capsule Take 50 mg by mouth daily as needed (feet neuropathy).    [provider]  Saw Palmetto 450 MG CAPS Take 450 mg by mouth daily.    [provider]  Selenium (SELENIMIN-200 PO) Take 200 mg by mouth daily.    [provider]  simethicone (MYLICON) 660 MG chewable tablet Chew 125 mg by mouth in the morning.    [provider]  simvastatin (ZOCOR) 20 MG tablet Take 20 mg by mouth  daily before supper.    [provider]  Testosterone 40.5 MG/2.5GM (1.62%) GEL Place 4 Pump onto the skin in the morning. Applied to shoulders    [provider]  traZODone (DESYREL) 50 MG tablet Take 50 mg by mouth at bedtime as needed for sleep.    [provider]  triamcinolone (NASACORT) 55 MCG/ACT AERO nasal inhaler Place 2 sprays into the nose daily.    [provider]  TURMERIC CURCUMIN PO Take 1,500 mg by mouth daily.    [provider]  umeclidinium-vilanterol (ANORO ELLIPTA) 62.5-25 MCG/INH AEPB Inhale 1 puff into the lungs daily as needed (respiratory issues.).    [provider]  vitamin C (ASCORBIC ACID) 500 MG tablet Take 500 mg by mouth in the morning.    [provider]  vitamin E 180 MG (400 UNITS) capsule Take 400 Units by mouth daily.    [provider]  VOLTAREN 1 % GEL Apply 2 g topically 4 (four) times daily as needed (pain). 12/20/14   [provider]  Zinc 50 MG TABS Take 50 mg by mouth daily.    [provider]  zolpidem (AMBIEN) 10 MG tablet Take 10 mg by mouth at bedtime as needed  for sleep. 03/06/21   [provider]     Family History  Problem Relation Age of Onset  . Cancer Mother   . Heart disease Father     Social History   Socioeconomic History  . Marital status: Married    Spouse name: Not on file  . Number of children: Not on file  . Years of education: Not on file  . Highest education level: Not on file  Occupational History  . Not on file  Tobacco Use  . Smoking status: Current Every Day Smoker    Packs/day: 0.50    Types: Cigarettes    Start date: 11/16/1978  . Smokeless tobacco: Never Used  Vaping Use  . Vaping Use: Never used  Substance and Sexual Activity  . Alcohol use: Yes    Alcohol/week: 3.0 standard drinks    Types: 3 Standard drinks or equivalent per week    Comment: occasional  . Drug use: No  . Sexual activity: Yes  Other Topics Concern  . Not on file  Social History Narrative  . Not on file   Social Determinants of Health   Financial Resource Strain: Not on file  Food Insecurity: Not on file  Transportation Needs: Not on file  Physical Activity: Not on file  Stress: Not on file  Social Connections: Not on file     Review of Systems: A 12 point ROS discussed and pertinent positives are indicated in the HPI above.  All other systems are negative.  Review of Systems  Constitutional: Negative for fatigue and fever.  Eyes: Positive for visual disturbance.  Respiratory: Negative for cough and shortness of breath.   Cardiovascular: Negative for chest pain.  Gastrointestinal: Negative for abdominal pain.  Genitourinary: Negative for dysuria.  Musculoskeletal: Negative for back pain.  Psychiatric/Behavioral: Negative for behavioral problems and confusion.    Vital Signs: BP (!) 143/86   Pulse 62   Temp 98 F (36.7 C) (Oral)   Resp 17   Ht 6\' 4"  (1.93 m)   Wt 202 lb (91.6 kg)   SpO2 95%   BMI 24.59 kg/m   Physical Exam Vitals and nursing note reviewed.  Constitutional:      General: He is not in  acute  distress.    Appearance: Normal appearance. He is not ill-appearing.  HENT:     Mouth/Throat:     Mouth: Mucous membranes are moist.     Pharynx: Oropharynx is clear.  Cardiovascular:     Rate and Rhythm: Normal rate and regular rhythm.  Pulmonary:     Effort: Pulmonary effort is normal. No respiratory distress.     Breath sounds: Normal breath sounds.  Abdominal:     General: Abdomen is flat.     Palpations: Abdomen is soft.  Skin:    General: Skin is warm and dry.  Neurological:     General: No focal deficit present.     Mental Status: He is alert and oriented to person, place, and time. Mental status is at baseline.  Psychiatric:        Mood and Affect: Mood normal.        Behavior: Behavior normal.        Thought Content: Thought content normal.        Judgment: Judgment normal.      MD Evaluation Airway: WNL Heart: WNL Abdomen: WNL Chest/ Lungs: WNL ASA  Classification: 3 Mallampati/Airway Score: One   Imaging: MR BRAIN W WO CONTRAST  Result Date: 03/27/2021 CLINICAL DATA:  Left homonymous hemianopsia. Headache. Duration of symptoms 6 weeks. EXAM: MRI HEAD WITHOUT AND WITH CONTRAST TECHNIQUE: Multiplanar, multiecho pulse sequences of the brain and surrounding structures were obtained without and with intravenous contrast. CONTRAST:  74mL GADAVIST GADOBUTROL 1 MMOL/ML IV SOLN COMPARISON:  None. FINDINGS: Brain: No sign of acute ischemic infarction. No abnormality affects the brainstem, within the superomedial right cerebellum, there is a 4 mm ring-enhancing lesion axial image 64 consistent with a metastasis. Within the right occipital lobe, there is a necrotic metastasis measuring up to 4.2 cm in diameter with pronounced regional vasogenic edema and mass effect. Broad surface along the superior surface of the tentorium in the posteromedial falx. No evidence of venous invasion of either the torcula or the straight sinus. 6 mm metastasis in the left basal ganglia axial  image 89. 5 mm metastasis medial right parietal lobe axial image 94. 9 mm metastasis lateral posterior frontal region on the left axial image 103. 5 mm metastasis right parietal region axial image 111. 15 mm metastasis left posterior frontal lobe axial image 113. A mm metastasis right lateral parietal lobe axial image 116. 3 mm metastasis medial left parietal lobe axial image 120. 3 mm metastasis medial left parietal lobe axial image 121. 3 mm metastasis right frontal lobe axial image 123. 8 mm metastasis left lateral posterior frontal region axial image 124. 2 mm metastasis left posterior frontal lobe axial image 125. 3 mm metastasis right frontoparietal junction axial image 129. 3 mm metastasis right posterior frontal vertex axial image 139. 2 mm metastasis left posterior frontal vertex axial image 139. This as up to 16 discrete metastatic lesions. Vasogenic edema and mass effect or most pronounced in the right occipital and parietal region but also present within the left posterior frontal brain. No hydrocephalus. No extra-axial collection. No skull or skull base metastasis. Vascular: Major vessels at the base of the brain show flow. Skull and upper cervical spine: Negative Sinuses/Orbits: Clear except for a retention cyst of the anterior inferior left maxillary sinus. Orbits negative. Other: None IMPRESSION: Total of 16 metastases, 15 supra tentorial and 1 cerebellar. The largest lesion is in the right occipital lobe measuring up to 4.2 cm in diameter, with extensive regional vasogenic edema. This  is likely responsible for the visual symptoms. Mass-effect because of that lesion and the associated edema. Vasogenic edema also present in the left posterior frontal lobe associated with the 15 mm metastasis in that region. Many the metastases show necrosis, more notable with increasing lesion size. These results will be called to the ordering clinician or representative by the Radiologist Assistant, and communication  documented in the PACS or [employer]. Electronically Signed   By: Nelson Chimes M.D.   On: 03/27/2021 13:04   NM PET Image Initial (PI) Skull Base To Thigh  Result Date: 04/01/2021 CLINICAL DATA:  Initial treatment strategy for unknown primary. EXAM: NUCLEAR MEDICINE PET SKULL BASE TO THIGH TECHNIQUE: 11.0 mCi F-18 FDG was injected intravenously. Full-ring PET imaging was performed from the skull base to thigh after the radiotracer. CT data was obtained and used for attenuation correction and anatomic localization. Fasting blood glucose: 133 mg/dl COMPARISON:  None. FINDINGS: Mediastinal blood pool activity: SUV max 2.4 Liver activity: SUV max NA NECK: No hypermetabolic lymph nodes in the neck. Peripherally metabolic lesion in the RIGHT occipital lobe with a rim of hypermetabolism consistent with brain metastasis with associated vasogenic edema. (Image 4) Incidental CT findings: none CHEST: 15 mm hypermetabolic nodule in the RIGHT lung apex with SUV max equal 5.3 on image 87. Hypermetabolic RIGHT suprahilar mass measures 3.8 by 3.3 cm and has intense metabolic activity with SUV max equal 9.7. Mass extends into the RIGHT lower paratracheal nodal station. No hypermetabolic contralateral lymph nodes. No hypermetabolic supraclavicular nodes. Incidental CT findings: none ABDOMEN/PELVIS: No abnormal hypermetabolic activity within the liver, pancreas, adrenal glands, or spleen. No hypermetabolic lymph nodes in the abdomen or pelvis. Incidental CT findings: LEFT colon diverticulosis. Atherosclerotic calcification of the aorta. Peripherally calcified LEFT testicle. SKELETON: No focal hypermetabolic activity to suggest skeletal metastasis. Incidental CT findings: none IMPRESSION: 1. Hypermetabolic pulmonary nodule in the medial aspect of the RIGHT lung apex most consistent with primary bronchogenic carcinoma. 2. Metastatic nodal mass to the RIGHT suprahilar nodal station with extension into the RIGHT lower  paratracheal nodal station. 3. Brain metastasis identified in the RIGHT occipital lobe. See recent MRI. Electronically Signed   By: Suzy Bouchard M.D.   On: 04/01/2021 12:42    Labs:  CBC: No results for input(s): WBC, HGB, HCT, PLT in the last 8760 hours.  COAGS: No results for input(s): INR, APTT in the last 8760 hours.  BMP: No results for input(s): NA, K, CL, CO2, GLUCOSE, BUN, CALCIUM, CREATININE, GFRNONAA, GFRAA in the last 8760 hours.  Invalid input(s): CMP  LIVER FUNCTION TESTS: No results for input(s): BILITOT, AST, ALT, ALKPHOS, PROT, ALBUMIN in the last 8760 hours.  TUMOR MARKERS: No results for input(s): AFPTM, CEA, CA199, CHROMGRNA in the last 8760 hours.  Assessment and Plan: Patient with past medical history of cirrhosis, aortic valve replacement, HTN, recent diagnosis of metastatic cancer of suspected bronchiogenic origin who presents for Port-A-Cath placement at the request of Dr. Grayland Ormond. Case reviewed by Dr. Laurence Ferrari who approves patient for procedure.  Patient presents today in their usual state of health.  He has been NPO and is not currently on blood thinners.    Risks and benefits of image guided port-a-catheter placement was discussed with the patient including, but not limited to bleeding, infection, pneumothorax, or fibrin sheath development and need for additional procedures.  All of the patient's questions were answered, patient is agreeable to proceed. Consent signed and in chart.  Thank you for this interesting consult.  I greatly enjoyed meeting Newel Oien and look forward to participating in their care.  A copy of this report was sent to the requesting provider on this date.  Electronically Signed: Docia Barrier, PA 04/11/2021, 9:41 AM   I spent a total of  30 Minutes   in face to face in clinical consultation, greater than 50% of which was counseling/coordinating care for brain metastases.

## 2021-04-11 ENCOUNTER — Other Ambulatory Visit: Payer: Self-pay

## 2021-04-11 ENCOUNTER — Ambulatory Visit
Admission: RE | Admit: 2021-04-11 | Discharge: 2021-04-11 | Disposition: A | Discharge status: Home / Self Care | Payer: Medicare Other | Source: Ambulatory Visit | Attending: Oncology | Admitting: Oncology

## 2021-04-11 DIAGNOSIS — Z881 Allergy status to other antibiotic agents status: Secondary | ICD-10-CM | POA: Diagnosis not present

## 2021-04-11 DIAGNOSIS — F17210 Nicotine dependence, cigarettes, uncomplicated: Secondary | ICD-10-CM | POA: Diagnosis not present

## 2021-04-11 DIAGNOSIS — Z888 Allergy status to other drugs, medicaments and biological substances status: Secondary | ICD-10-CM | POA: Diagnosis not present

## 2021-04-11 DIAGNOSIS — Z952 Presence of prosthetic heart valve: Secondary | ICD-10-CM | POA: Insufficient documentation

## 2021-04-11 DIAGNOSIS — K219 Gastro-esophageal reflux disease without esophagitis: Secondary | ICD-10-CM | POA: Diagnosis not present

## 2021-04-11 DIAGNOSIS — I10 Essential (primary) hypertension: Secondary | ICD-10-CM | POA: Insufficient documentation

## 2021-04-11 DIAGNOSIS — K7460 Unspecified cirrhosis of liver: Secondary | ICD-10-CM | POA: Diagnosis not present

## 2021-04-11 DIAGNOSIS — Z79899 Other long term (current) drug therapy: Secondary | ICD-10-CM | POA: Diagnosis not present

## 2021-04-11 DIAGNOSIS — C3490 Malignant neoplasm of unspecified part of unspecified bronchus or lung: Secondary | ICD-10-CM | POA: Diagnosis not present

## 2021-04-11 DIAGNOSIS — F419 Anxiety disorder, unspecified: Secondary | ICD-10-CM | POA: Diagnosis not present

## 2021-04-11 DIAGNOSIS — C7931 Secondary malignant neoplasm of brain: Secondary | ICD-10-CM | POA: Diagnosis not present

## 2021-04-11 DIAGNOSIS — Z7982 Long term (current) use of aspirin: Secondary | ICD-10-CM | POA: Diagnosis not present

## 2021-04-11 DIAGNOSIS — Z452 Encounter for adjustment and management of vascular access device: Secondary | ICD-10-CM | POA: Diagnosis not present

## 2021-04-11 HISTORY — PX: IR IMAGING GUIDED PORT INSERTION: IMG5740

## 2021-04-11 MED ORDER — SODIUM CHLORIDE 0.9 % IV SOLN: INTRAVENOUS | Status: DC

## 2021-04-11 MED ORDER — FENTANYL CITRATE (PF) 100 MCG/2ML IJ SOLN
INTRAMUSCULAR | Status: AC
  Filled 2021-04-11: qty 2

## 2021-04-11 MED ORDER — MIDAZOLAM HCL 2 MG/2ML IJ SOLN
INTRAMUSCULAR | Status: AC
  Filled 2021-04-11: qty 2

## 2021-04-11 MED ORDER — MIDAZOLAM HCL 2 MG/2ML IJ SOLN
INTRAMUSCULAR | Status: AC | PRN
  Administered 2021-04-11 (×2): 1 mg via INTRAVENOUS

## 2021-04-11 MED ORDER — FENTANYL CITRATE (PF) 100 MCG/2ML IJ SOLN
INTRAMUSCULAR | Status: AC | PRN
  Administered 2021-04-11 (×2): 50 ug via INTRAVENOUS

## 2021-04-11 NOTE — Procedures (Signed)
Interventional Radiology Procedure Note  Procedure: Placement of a right IJ approach single lumen PowerPort.  Tip is positioned at the superior cavoatrial junction and catheter is ready for immediate use.  Complications: No immediate Recommendations:  - Ok to shower tomorrow - Do not submerge for 7 days - Routine line care   Signed,  Sheena Simonis K. Keily Lepp, MD   

## 2021-04-15 ENCOUNTER — Other Ambulatory Visit: Payer: Self-pay

## 2021-04-15 ENCOUNTER — Other Ambulatory Visit: Payer: Self-pay | Admitting: Pulmonary Disease

## 2021-04-15 ENCOUNTER — Other Ambulatory Visit
Admission: RE | Admit: 2021-04-15 | Discharge: 2021-04-15 | Disposition: A | Discharge status: Home / Self Care | Payer: Medicare Other | Source: Ambulatory Visit | Attending: Pulmonary Disease | Admitting: Pulmonary Disease

## 2021-04-15 ENCOUNTER — Encounter: Payer: Self-pay | Admitting: Urgent Care

## 2021-04-15 DIAGNOSIS — R911 Solitary pulmonary nodule: Secondary | ICD-10-CM

## 2021-04-15 NOTE — Patient Instructions (Signed)
Your procedure is scheduled on: 04/18/21 -  Friday Report to the Registration Desk on the 1st floor of the [facility]. To find out your arrival time, please call [phone] between 1PM - 3PM on: Thursday -  04/17/21 - Report to [facility] on 04/17/21 at 9 am for Labs/Covid.  REMEMBER: Instructions that are not followed completely may result in serious medical risk, up to and including death; or upon the discretion of your surgeon and anesthesiologist your surgery may need to be rescheduled.  Do not eat food after midnight the night before surgery.  No gum chewing, lozengers or hard candies.  You may however, drink CLEAR liquids up to 2 hours before you are scheduled to arrive for your surgery. Do not drink anything within 2 hours of your scheduled arrival time.  Clear liquids include: - water  - apple juice without pulp - gatorade (not RED, PURPLE, OR BLUE) - black coffee or tea (Do NOT add milk or creamers to the coffee or tea) Do NOT drink anything that is not on this list.  TAKE THESE MEDICATIONS THE MORNING OF SURGERY WITH A SIP OF WATER:  - dexamethasone (DECADRON) 4 MG tablet - metoprolol (LOPRESSOR) 50 MG tablet - omeprazole (PRILOSEC) 20 MG capsule, take one the night before and one on the morning of surgery - helps to prevent nausea after surgery.  Use umeclidinium-vilanterol (ANORO ELLIPTA) 62.5-25 MCG/INH AEPB on the day of surgery .  Follow recommendations from Cardiologist, Pulmonologist or PCP regarding stopping Aspirin, Coumadin, Plavix, Eliquis, Pradaxa, or Pletal. Stop taking beginning 04/15/21.  One week prior to surgery: Stop Anti-inflammatories (NSAIDS) such as Advil, Aleve, Ibuprofen, Motrin, Naproxen, Naprosyn and Aspirin based products such as Excedrin, Goodys Powder, BC Powder.  Stop ANY OVER THE COUNTER supplements until after surgery. Starting 04/15/21 .  You may to take Tylenol if needed for pain up until the day of surgery.  No Alcohol for 24  hours before or after surgery.  No Smoking including e-cigarettes for 24 hours prior to surgery.  No chewable tobacco products for at least 6 hours prior to surgery.  No nicotine patches on the day of surgery.  Do not use any "recreational" drugs for at least a week prior to your surgery.  Please be advised that the combination of cocaine and anesthesia may have negative outcomes, up to and including death. If you test positive for cocaine, your surgery will be cancelled.  On the morning of surgery brush your teeth with toothpaste and water, you may rinse your mouth with mouthwash if you wish. Do not swallow any toothpaste or mouthwash.  Do not wear jewelry, make-up, hairpins, clips or nail polish.  Do not wear lotions, powders, or perfumes.   Do not shave body from the neck down 48 hours prior to surgery just in case you cut yourself which could leave a site for infection.  Also, freshly shaved skin may become irritated if using the CHG soap.  Contact lenses, hearing aids and dentures may not be worn into surgery.  Do not bring valuables to the hospital. [facility] is not responsible for any missing/lost belongings or valuables.   Notify your doctor if there is any change in your medical condition (cold, fever, infection).  Wear comfortable clothing (specific to your surgery type) to the hospital.  Plan for stool softeners for home use; pain medications have a tendency to cause constipation. You can also help prevent constipation by eating foods high in fiber such as fruits  and vegetables and drinking plenty of fluids as your diet allows.  After surgery, you can help prevent lung complications by doing breathing exercises.  Take deep breaths and cough every 1-2 hours. Your doctor may order a device called an Incentive Spirometer to help you take deep breaths. When coughing or sneezing, hold a pillow firmly against your incision with both hands. This is called "splinting." Doing  this helps protect your incision. It also decreases belly discomfort.  If you are being admitted to the hospital overnight, leave your suitcase in the car. After surgery it may be brought to your room.  If you are being discharged the day of surgery, you will not be allowed to drive home. You will need a responsible adult (18 years or older) to drive you home and stay with you that night.   If you are taking public transportation, you will need to have a responsible adult (18 years or older) with you. Please confirm with your physician that it is acceptable to use public transportation.   Please call the [facility] at [phone] if you have any questions about these instructions.  Surgery Visitation Policy:  Patients undergoing a surgery or procedure may have one family member or support person with them as long as that person is not COVID-19 positive or experiencing its symptoms.  That person may remain in the waiting area during the procedure.  Inpatient Visitation:    Visiting hours are 7 a.m. to 8 p.m. Inpatients will be allowed two visitors daily. The visitors may change each day during the patient's stay. No visitors under the age of 85. Any visitor under the age of 59 must be accompanied by an adult. The visitor must pass COVID-19 screenings, use hand sanitizer when entering and exiting the patient's room and wear a mask at all times, including in the patient's room. Patients must also wear a mask when staff or their visitor are in the room. Masking is required regardless of vaccination status.

## 2021-04-15 NOTE — Progress Notes (Signed)
Perioperative Services  Pre-Admission/Anesthesia Testing Clinical Review  Date: 04/17/21  Patient Demographics:  Name: Matthew Kelly DOB:   1956-04-09 MRN:   [id number]  Planned Surgical Procedure(s):    Case: [id number] Date/Time: 04/18/21 1200   Procedures:      VIDEO BRONCHOSCOPY WITH ENDOBRONCHIAL NAVIGATION (N/A )     VIDEO BRONCHOSCOPY WITH ENDOBRONCHIAL ULTRASOUND (N/A )   Anesthesia type: General   Pre-op diagnosis: Lung Mass R91.8   Location: [facility] PROCEDURE RM 02 / [facility] ORS FOR ANESTHESIA GROUP   Surgeons: [name], MD    NOTE: Available PAT nursing documentation and vital signs have been reviewed. Clinical nursing staff has updated patient's PMH/PSHx, current medication list, and drug allergies/intolerances to ensure comprehensive history available to assist in medical decision making as it pertains to the aforementioned surgical procedure and anticipated anesthetic course. Extensive review of available clinical information performed. [place] PMH and PSHx updated with any diagnoses/procedures that  may have been inadvertently omitted during his intake with the pre-admission testing department's nursing staff.  Clinical Discussion:  Matthew Kelly is a 65 y.o. male who is submitted for pre-surgical anesthesia review and clearance prior to him undergoing the above procedure. Patient is a Current Smoker. 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  Patient is followed by cardiology Ubaldo Glassing, MD). He was last seen in the cardiology clinic on 03/27/2021; notes reviewed.  At the time of his clinic visit, patient doing well overall from a cardiovascular  perspective.  He denied any episodes of chest pain, however he has remained short of breath related to his underlying heart failure, COPD, and presumably due to his newly diagnosed bronchogenic carcinoma.  Patient denied any PND, orthopnea, palpitations, significant peripheral edema, vertiginous symptoms, or presyncope/syncope.  Patient with a PMH significant for cardiovascular diagnoses.   Patient underwent Wheat procedure (AVR + TAA repair) on 06/01/2013.   Cardiac MRI performed on 06/21/2020 revealed globally normal systolic function with an EF of 70%.  There was mild atrial enlargement.  Bioprosthetic aortic valve well-seated with partial fusion of the right and left coronary cusps and mild leaflet thickening with restriction in leaflet mobility.  There was moderate aortic stenosis noted peak velocity 3.6 m/s with an AVA of 1.4 cm.  There was no significant aortic valve regurgitation or other valvular lesions.  Imaging revealed a few small areas of scarring in the apical anterior and apical lateral walls that were unchanged from present.  These areas felt to represent residual changes from previous cardiovascular surgery.   TTE performed on 02/04/2021 revealed globally normal systolic function with moderate LVH.  LVEF estimated at >55%.  There was moderate biatrial enlargement and mild right ventricular enlargement.  Bioprosthetic aortic valve stenosis present; mean gradient 39.2 mmHg (see full interpretation of cardiovascular testing below).  Patient on GDMT for his HTN and HLD diagnoses.  Blood pressure moderately controlled at 140/80 on currently prescribed ARB and beta-blocker therapies.  Patient is on a statin for his HLD.  Patient with a history of hypergonadism following orchiectomy; utilizes an exogenous testosterone gel for HRT purposes. Functional capacity, as defined by DASI, is documented as being >/= 4 METS.  No changes were made to patient's medication regimen.  Patient to follow-up  with outpatient cardiology in  1 month or sooner if needed.  Patient with a diagnosis of primary bronchogenic carcinoma with confirmed metastasis to the brain. Patient is scheduled for an ENB/EBUS on 04/18/2021 with Dr. [name]. Tissue biopsy required for diagnosis and staging purposes.  Patient is under the care of oncology here at [facility]; sees Dr. Delight Hoh. PET imaging performed on 03/31/2021 revealed hypermetabolic pulmonary nodules in the medial aspect of the RIGHT lung consistent with primary bronchogenic carcinoma.  Additionally, there was a metastatic mass to the RIGHT suprahilar nodal station with extension into the RIGHT lower paratracheal nodal station.  MRI of the brain revealed a total of 16 metastases (15 supratentorial and 1 cerebellar) with the largest measuring 4.2 cm in diameter.  There was extensive vasogenic edema.  Patient currently on oral corticosteroids to mitigate the symptoms. Patient underwent implanted port-a-cath placement on 04/11/2021 with interventional radiology in preparation for anticipated treatment with antineoplastic chemotherapy.    Given patient's past medical history significant for cardiovascular diagnoses, presurgical cardiac clearance was sought by the PAT team. Per cardiology, "this patient is optimized for surgery and may proceed with the planned procedural course with an ACCEPTABLE risk stratification". This patient is on daily antiplatelet therapy. He has been instructed on recommendations for holding his daily low-dose ASA prior to his procedure with plans to restart as soon as postoperative bleeding risk felt to be minimized by his attending surgeon. The patient's last dose of ASA was taken on 04/14/2021.  Patient denies previous perioperative complications with anesthesia in the past. In review of the available records, it is noted that patient underwent a MAC anesthetic course at [facility] (ASA III) in 12/2020  without documented complications.   Vitals with BMI 04/11/2021 04/11/2021 04/11/2021  Height - - -  Weight - - -  BMI - - -  Systolic [phone]  Diastolic 84 72 82  Pulse 67 65 67    Providers/Specialists:   NOTE: Primary physician provider listed below. Patient may have been seen by APP or partner within same practice.   PROVIDER ROLE / SPECIALTY LAST Charlynn Grimes, MD  Pulmonary Medicine  04/07/2021  Josetta Huddle, MD  Primary Care Provider  02/27/2021  Bartholome Bill, MD   Cardiology  03/27/2021  Delight Hoh, MD  Medical Oncology  04/03/2021  Berton Mount, MD  Radiation Oncology  04/08/2021    Allergies:  Avelox [moxifloxacin], Lexapro [escitalopram oxalate], Rifampin, and Wellbutrin [bupropion]  Current Home Medications:   No current facility-administered medications for this encounter.   Marland Kitchen Acetylcysteine (NAC) 600 MG CAPS  . Alpha Lipoic Acid 200 MG CAPS  . ALPRAZolam (XANAX) 0.5 MG tablet  . Arginine (L-ARGININE-500) 500 MG CAPS  . aspirin EC 81 MG tablet  . B Complex-C (B-COMPLEX WITH VITAMIN C) tablet  . Biotin 1000 MCG tablet  . Cholecalciferol (VITAMIN D3) 125 MCG (5000 UT) TABS  . CINNAMON PO  . Coenzyme Q10 (COQ10) 100 MG CAPS  . Cyanocobalamin (VITAMIN B-12) 3000 MCG SUBL  . cyclobenzaprine (FLEXERIL) 10 MG tablet  . dexamethasone (DECADRON) 4 MG tablet  . Dextran 70-Hypromellose, PF, (TEARS NATURALE FREE) 0.1-0.3 % SOLN  . dicyclomine (BENTYL) 20 MG tablet  . diphenhydrAMINE HCl (ZZZQUIL) 50 MG/30ML LIQD  . diphenoxylate-atropine (LOMOTIL) 2.5-0.025 MG per tablet  . fexofenadine (ALLEGRA) 180 MG tablet  . Ginkgo Biloba 120 MG TABS  . guaifenesin (HUMIBID E) 400 MG TABS tablet  . hydrocortisone (ANUSOL-HC) 2.5 % rectal cream  . losartan (  COZAAR) 100 MG tablet  . Lutein-Zeaxanthin 20-1 MG CAPS  . melatonin 5 MG TABS  . metoprolol (LOPRESSOR) 50 MG tablet  . milk thistle 175 MG tablet  . Multiple Vitamin (MULTIVITAMIN WITH MINERALS) TABS  tablet  . Naphazoline-Pheniramine (OPCON-A) 0.027-0.315 % SOLN  . naproxen sodium (ALEVE) 220 MG tablet  . Omega-3 Fatty Acids (FISH OIL) 1200 MG CAPS  . omeprazole (PRILOSEC) 20 MG capsule  . oxyCODONE (OXY IR/ROXICODONE) 5 MG immediate release tablet  . pregabalin (LYRICA) 50 MG capsule  . Saw Palmetto 450 MG CAPS  . Selenium (SELENIMIN-200 PO)  . simethicone (MYLICON) 825 MG chewable tablet  . simvastatin (ZOCOR) 20 MG tablet  . Testosterone 40.5 MG/2.5GM (1.62%) GEL  . traZODone (DESYREL) 50 MG tablet  . triamcinolone (NASACORT) 55 MCG/ACT AERO nasal inhaler  . TURMERIC CURCUMIN PO  . umeclidinium-vilanterol (ANORO ELLIPTA) 62.5-25 MCG/INH AEPB  . vitamin C (ASCORBIC ACID) 500 MG tablet  . vitamin E 180 MG (400 UNITS) capsule  . VOLTAREN 1 % GEL  . Zinc 50 MG TABS  . zolpidem (AMBIEN) 10 MG tablet  . mupirocin ointment (BACTROBAN) 2 %   History:   Past Medical History:  Diagnosis Date  . Anxiety   . Aortic atherosclerosis ([place])   . Aortic stenosis    s/p AVR in 05/2013  . Arthritis   . Bicuspid aortic valve   . Bronchogenic carcinoma of right lung ([place])    MRI confirmed brain metastasis  . CHF (congestive heart failure) ([place])   . COPD (chronic obstructive pulmonary disease) ([place])   . DDD (degenerative disc disease), lumbar   . Dysplastic nevus 12/30/2020   R post waistline - moderate  . Erectile dysfunction   . GERD (gastroesophageal reflux disease)   . Heart murmur   . Hepatitis C    dx'd 1980s.  treated with Harvoni approx 2015  . HLD (hyperlipidemia)   . Homonymous hemianopia, left   . Hypertension   . Hypogonadism in male   . Insomnia   . Pericarditis   . Spinal stenosis, lumbar 07/25/2019   severe  . Squamous cell carcinoma of skin 12/18/2019   SCCIS L lat ankle  . Squamous cell carcinoma of skin 11/07/2008   SCCIS L wrist  . Thoracic aortic aneurysm [facility])    s/p aortic arch/hemi-arch repair in 05/2013   Past Surgical History:  Procedure  Laterality Date  . AORTIC VALVE REPLACEMENT N/A 06/01/2013   Procedure: REPLACEMENT AORTIC VALVE; Surgeon: Gretchen Portela, MD; Location: [facility] OPERATING ROOMS; Service: Cardiothoracic; Laterality: N/A; Bioprosthetic 25 mm [employer] Jude trifecta bovine pericardial tissue valve  . CATARACT EXTRACTION W/PHACO Right 12/17/2020   Procedure: CATARACT EXTRACTION PHACO AND INTRAOCULAR LENS PLACEMENT (IOC) RIGHT 6.67 00:49.5;  Surgeon: Birder Robson, MD;  Location: [place];  Service: Ophthalmology;  Laterality: Right;  . COLONOSCOPY WITH PROPOFOL N/A 01/04/2017   Procedure: COLONOSCOPY WITH PROPOFOL;  Surgeon: Garlan Fair, MD;  Location: [facility];  Service: Endoscopy;  Laterality: N/A;  . IR IMAGING GUIDED PORT INSERTION  04/11/2021  . ORCHIECTOMY Left 2000  . REPAIR THORACIC AORTA N/A 06/01/2013   Procedure: REPAIR THORACIC AORTIC ANEURYSM W/TRANSVERSE ARCH GRAFT - HemiArch; Surgeon: Gretchen Portela, MD; Location: [facility]PERATING [facility]OMS; Service: Cardiothoracic; Laterality: N/A; #24 mm Dacron graft   Family History  Problem Relation Age of Onset  . Cancer Mother   . Heart disease Father    Social History   Tobacco Use  . Smoking status: Current  Every Day Smoker    Packs/day: 0.50    Types: Cigarettes    Start date: 11/16/1978  . Smokeless tobacco: Never Used  Vaping Use  . Vaping Use: Never used  Substance Use Topics  . Alcohol use: Yes    Alcohol/week: 3.0 standard drinks    Types: 3 Standard drinks or equivalent per week    Comment: occasional  . Drug use: No    Pertinent Clinical Results:  LABS: Labs reviewed: Acceptable for surgery.   Hospital Outpatient Visit on 04/17/2021  Component Date Value Ref Range Status  . Prothrombin Time 04/17/2021 12.9  11.4 - 15.2 seconds Final  . INR 04/17/2021 1.0  0.8 - 1.2 Final   Comment: (NOTE) INR goal varies based on device and disease states. Performed at [facility], [address],  [place], [place] [address]   . aPTT 04/17/2021 24  24 - 36 seconds Final   Performed at [facility], [address] [place], [place] [address]  . WBC 04/17/2021 21.5* 4.0 - 10.5 K/uL Final  . RBC 04/17/2021 4.43  4.22 - 5.81 MIL/uL Final  . Hemoglobin 04/17/2021 13.7  13.0 - 17.0 g/dL Final  . HCT 04/17/2021 40.7  39.0 - 52.0 % Final  . MCV 04/17/2021 91.9  80.0 - 100.0 fL Final  . MCH 04/17/2021 30.9  26.0 - 34.0 pg Final  . MCHC 04/17/2021 33.7  30.0 - 36.0 g/dL Final  . RDW 04/17/2021 15.5  11.5 - 15.5 % Final  . Platelets 04/17/2021 207  150 - 400 K/uL Final  . nRBC 04/17/2021 0.0  0.0 - 0.2 % Final   Performed at [facility], [address], [place], [place] [address]  . Sodium 04/17/2021 135  135 - 145 mmol/L Final  . Potassium 04/17/2021 4.4  3.5 - 5.1 mmol/L Final  . Chloride 04/17/2021 100  98 - 111 mmol/L Final  . CO2 04/17/2021 25  22 - 32 mmol/L Final  . Glucose, Bld 04/17/2021 103* 70 - 99 mg/dL Final   Glucose reference range applies only to samples taken after fasting for at least 8 hours.  . BUN 04/17/2021 21  8 - 23 mg/dL Final  . Creatinine, Ser 04/17/2021 0.76  0.61 - 1.24 mg/dL Final  . Calcium 04/17/2021 8.5* 8.9 - 10.3 mg/dL Final  . GFR, Estimated 04/17/2021 >60  >60 mL/min Final   Comment: (NOTE) Calculated using the CKD-EPI Creatinine Equation (2021)   . Anion gap 04/17/2021 10  5 - 15 Final   Performed at [facility], [place],  [address]    ECG: Date: 04/17/2021 Time ECG obtained: 0923 AM  Rate: 62 bpm Rhythm: Normal sinus rhythm Axis (leads I and aVF): normal axis Intervals: PR 142 ms. QRS 88 ms. QTc 440 ms. ST segment and T wave changes: Non specific ST and T wave abnormalities Comparison: Similar to previous tracing obtained on 07/09/2020; performed at [facility] / PROCEDURES: CT SUPER D CHEST WITHOUT CONTRAST performed on 04/17/2021 1. Irregular RIGHT upper lobe nodule and RIGHT  hilar adenopathy compatible with bronchogenic neoplasm as seen on recent PET-CT. 2. New nodule since June of 2020 in the RIGHT middle lobe and stable to slightly increased size of irregular area of nodularity and septal thickening in the RIGHT lower lobe could represent multifocal bronchogenic neoplasm, not shown to be hypermetabolic on the previous PET exam above mediastinal blood pool. 3. Aortic atherosclerosis  PET SCAN INITIAL SKULL  BASE TO THIGH performed on 03/31/2021 1. Hypermetabolic pulmonary nodule in the medial aspect of the RIGHT lung apex most consistent with primary bronchogenic carcinoma. 2. Metastatic nodal mass to the RIGHT suprahilar nodal station with extension into the RIGHT lower paratracheal nodal station. 3. Brain metastasis identified in the RIGHT occipital lobe. See recent MRI.  MRI BRAIN WITH/WITHOUT CONTRAST performed on 03/27/2021 1. Total of 16 metastases, 15 supra tentorial and 1 cerebellar. The largest lesion is in the right occipital lobe measuring up to 4.2 cm in diameter, with extensive regional vasogenic edema. This is likely responsible for the visual symptoms.  2. Mass-effect because of that lesion and the associated edema.  3. Vasogenic edema also present in the left posterior frontal lobe associated with the 15 mm metastasis in that region.  4. Many the metastases show necrosis, more notable with increasing lesion size.  TRANSTHORACIC ECHOCARDIOGRAM performed on 02/05/2019 1. LVEF >55% 2. Normal left ventricular systolic function 3. Normal right ventricular systolic function 4. Moderate LVH 5. Moderate biatrial enlargement 6. Mild right ventricular enlargement 7. Moderate bioprosthetic aortic valve stenosis 8. Trivial mitral, pulmonic, and tricuspid valve regurgitation 9. Moderate aortic valve stenosis; mean gradient 39.2 mmHg 10. No evidence of pericardial effusion  CARDIAC MRI/MRA performed 06/21/2020 1. The left ventricle is normal in cavity size.  There is moderate concentric LV hypertrophy with a maximal wall thickness of 1.5 cm in the basal anteroseptum (prior 1.6 cm). Global systolic function is normal. The calculated LV ejection fraction is 70% (prior 63%). There are no regional wall motion abnormalities.  2. The right ventricle is normal in cavity size, wall thickness, and systolic function.  3. The left atrium is top-normal in size. The right atrium is mildly enlarged.  4. There is a well-seated bioprosthetic valve in the aortic position. There is partial fusion of the commissure between the right and left coronary cusps and mild leaflet thickening with restriction in leaflet mobility. There is moderate aortic stenosis. Peak aortic flow velocity measures 3.6 m/s (prior 2.1 m/s) with an AVA of 1.4 cm2 by planimetry. This is a peak systolic opening and is consistent with an average aortic valve opening measured by other modalities that is about 10-15% smaller (i.e the AVA measured by other modalities would be approximately 1.2 cm2). There is no significant aortic regurgitation. There are no other significant valvular lesions.  5. Delayed enhancement imaging demonstrates a few tiny areas of scarring in the apical anterior and apical lateral wall, unchanged from prior. These may represent residua from prior cardiovascular surgery.  6. There is a graft in the ascending aorta. The aortic root is normal in size. The ascending aorta, arch, and descending aorta are normal in diameter. There is no dissection seen.  7. The aortic arch is left sided. There is normal branching of the arch vessels.  8. The main and proximal branch pulmonary arteries are normal in size.  9. Normal systemic venous connections.  10. The proximal portions of the left and right coronary arteries are visualized and appear normal.  11. Bi-orthogonal luminal aortic dimensions with prior measurements from 04/15/2018 in parentheses:  Sinus of Valsalva (largest dimension,  cross-sectional dimension): 3.3 x 3.2 cm (prior: 3.4 x 3.2 cm)   Sinotubular junction: 2.5 x 2.5 cm (prior: 2.9 x 2.8 cm)   Mid-ascending aorta: 2.7 x 2.5 cm (prior: 2.9 x 2.6 cm)   Proximal aortic arch (aorta at the origin of the innominate artery): 2.6 x 2.5 cm (prior: 2.7 x 2.5 cm)  Mid-aortic arch (between left common carotid and subclavian arteries): 2.7 x 2.5 cm (prior: 2.6 x 2.5 cm)   Proximal descending thoracic aorta (begins at the isthmus, approximately 2 cm distal to the left subclavian  artery): 2.5 x 2.5 cm (prior: 2.6 x 2.3 cm)   Mid-descending aorta: 2.6 x 2.4 cm (prior: 2.5 x 2.4 cm)   Aorta at diaphragm: 2.2 x 2.1 cm (prior: 2.3 x 2.2 cm)  12. CONCLUSION: Compared to the prior scan performed on 04/15/2018, there have been no significant interval  changes in aortic dimensions. The degree of bioprosthetic aortic stenosis is worse as compared to the prior  scan.   Impression and Plan:  Dashton Czerwinski has been referred for pre-anesthesia review and clearance prior to him undergoing the planned anesthetic and procedural courses. Available labs, pertinent testing, and imaging results were personally reviewed by me. Patient with marked leukocytosis (WBC 21.5 K/uL) noted on today's labs. This finding is secondary to current oral corticosteroids being used to treat the vasogenic cerebral edema associated with the multiple brain metastases seen on recent MRI imaging.   This patient has been appropriately cleared by cardiology with an overall ACCEPTABLE risk of significant perioperative cardiovascular complications. Based on clinical review performed today (04/17/21), barring any significant acute changes in the patient's overall condition, it is anticipated that he will be able to proceed with the planned surgical intervention. Any acute changes in clinical condition may necessitate his procedure being postponed and/or cancelled. Patient will meet with anesthesia team (MD and/or CRNA)  on the day of his procedure for preoperative evaluation/assessment. Questions regarding anesthetic course will be fielded at that time.   Pre-surgical instructions were reviewed with the patient during his PAT appointment and questions were fielded by PAT clinical staff. Patient was advised that if any questions or concerns arise prior to his procedure then he should return a call to PAT and/or his surgeon's office to discuss.  Honor Loh, MSN, APRN, FNP-C, CEN [facility]  Peri-operative Services Nurse Practitioner Phone: [phone] 04/17/21 6:16 PM  NOTE: This note has been prepared using Dragon dictation software. Despite my best ability to proofread, there is always the potential that unintentional transcriptional errors may still occur from this process.

## 2021-04-16 ENCOUNTER — Ambulatory Visit
Admission: RE | Admit: 2021-04-16 | Discharge: 2021-04-16 | Disposition: A | Discharge status: Home / Self Care | Payer: Medicare Other | Source: Ambulatory Visit | Attending: Pulmonary Disease | Admitting: Pulmonary Disease

## 2021-04-16 ENCOUNTER — Encounter: Payer: Self-pay | Admitting: Urgent Care

## 2021-04-16 ENCOUNTER — Ambulatory Visit: Admission: RE | Admit: 2021-04-16 | Payer: Medicare Other | Source: Ambulatory Visit

## 2021-04-16 ENCOUNTER — Other Ambulatory Visit: Payer: Medicare Other

## 2021-04-16 DIAGNOSIS — I700 Atherosclerosis of aorta: Secondary | ICD-10-CM | POA: Diagnosis not present

## 2021-04-16 DIAGNOSIS — R911 Solitary pulmonary nodule: Secondary | ICD-10-CM | POA: Insufficient documentation

## 2021-04-16 DIAGNOSIS — I2510 Atherosclerotic heart disease of native coronary artery without angina pectoris: Secondary | ICD-10-CM | POA: Diagnosis not present

## 2021-04-16 DIAGNOSIS — C3402 Malignant neoplasm of left main bronchus: Secondary | ICD-10-CM | POA: Diagnosis not present

## 2021-04-16 DIAGNOSIS — R918 Other nonspecific abnormal finding of lung field: Secondary | ICD-10-CM | POA: Insufficient documentation

## 2021-04-16 DIAGNOSIS — J929 Pleural plaque without asbestos: Secondary | ICD-10-CM | POA: Diagnosis not present

## 2021-04-16 DIAGNOSIS — C7931 Secondary malignant neoplasm of brain: Secondary | ICD-10-CM | POA: Insufficient documentation

## 2021-04-17 ENCOUNTER — Other Ambulatory Visit
Admission: RE | Admit: 2021-04-17 | Discharge: 2021-04-17 | Disposition: A | Discharge status: Home / Self Care | Payer: Medicare Other | Source: Ambulatory Visit | Attending: Pulmonary Disease | Admitting: Pulmonary Disease

## 2021-04-17 ENCOUNTER — Other Ambulatory Visit: Payer: Self-pay

## 2021-04-17 ENCOUNTER — Ambulatory Visit
Admission: RE | Admit: 2021-04-17 | Discharge: 2021-04-17 | Disposition: A | Discharge status: Home / Self Care | Payer: Medicare Other | Source: Ambulatory Visit | Attending: Radiation Oncology | Admitting: Radiation Oncology

## 2021-04-17 ENCOUNTER — Encounter: Payer: Self-pay | Admitting: Urgent Care

## 2021-04-17 DIAGNOSIS — C7931 Secondary malignant neoplasm of brain: Secondary | ICD-10-CM | POA: Diagnosis not present

## 2021-04-17 DIAGNOSIS — R918 Other nonspecific abnormal finding of lung field: Secondary | ICD-10-CM | POA: Diagnosis not present

## 2021-04-17 DIAGNOSIS — Z01810 Encounter for preprocedural cardiovascular examination: Secondary | ICD-10-CM | POA: Diagnosis not present

## 2021-04-17 DIAGNOSIS — Z01818 Encounter for other preprocedural examination: Secondary | ICD-10-CM | POA: Diagnosis not present

## 2021-04-17 DIAGNOSIS — Z20822 Contact with and (suspected) exposure to covid-19: Secondary | ICD-10-CM | POA: Insufficient documentation

## 2021-04-17 LAB — BASIC METABOLIC PANEL
Anion gap: 10 (ref 5–15)
BUN: 21 mg/dL (ref 8–23)
CO2: 25 mmol/L (ref 22–32)
Calcium: 8.5 mg/dL — ABNORMAL LOW (ref 8.9–10.3)
Chloride: 100 mmol/L (ref 98–111)
Creatinine, Ser: 0.76 mg/dL (ref 0.61–1.24)
GFR, Estimated: >60 mL/min (ref >60)
Glucose, Bld: 103 mg/dL — ABNORMAL HIGH (ref 70–99)
Potassium: 4.4 mmol/L (ref 3.5–5.1)
Sodium: 135 mmol/L (ref 135–145)

## 2021-04-17 LAB — CBC
HCT: 40.7 % (ref 39.0–52.0)
Hemoglobin: 13.7 g/dL (ref 13.0–17.0)
MCH: 30.9 pg (ref 26.0–34.0)
MCHC: 33.7 g/dL (ref 30.0–36.0)
MCV: 91.9 fL (ref 80.0–100.0)
Platelets: 207 K/uL (ref 150–400)
RBC: 4.43 MIL/uL (ref 4.22–5.81)
RDW: 15.5 % (ref 11.5–15.5)
WBC: 21.5 K/uL — ABNORMAL HIGH (ref 4.0–10.5)
nRBC: 0 % (ref 0.0–0.2)

## 2021-04-17 LAB — PROTIME-INR
INR: 1 (ref 0.8–1.2)
Prothrombin Time: 12.9 s (ref 11.4–15.2)

## 2021-04-17 LAB — APTT: aPTT: 24 s (ref 24–36)

## 2021-04-18 ENCOUNTER — Ambulatory Visit
Admission: RE | Admit: 2021-04-18 | Discharge: 2021-04-18 | Disposition: A | Discharge status: Home / Self Care | Payer: Medicare Other | Source: Ambulatory Visit | Attending: Radiation Oncology | Admitting: Radiation Oncology

## 2021-04-18 ENCOUNTER — Encounter
Admission: RE | Disposition: A | Discharge status: Home / Self Care | Payer: Self-pay | Source: Home / Self Care | Attending: Pulmonary Disease

## 2021-04-18 ENCOUNTER — Ambulatory Visit: Payer: Medicare Other | Admitting: Urgent Care

## 2021-04-18 ENCOUNTER — Ambulatory Visit
Admission: RE | Admit: 2021-04-18 | Discharge: 2021-04-18 | Disposition: A | Discharge status: Home / Self Care | Payer: Medicare Other | Attending: Pulmonary Disease | Admitting: Pulmonary Disease

## 2021-04-18 ENCOUNTER — Ambulatory Visit: Payer: Medicare Other

## 2021-04-18 ENCOUNTER — Other Ambulatory Visit: Payer: Self-pay

## 2021-04-18 DIAGNOSIS — Z881 Allergy status to other antibiotic agents status: Secondary | ICD-10-CM | POA: Insufficient documentation

## 2021-04-18 DIAGNOSIS — F17210 Nicotine dependence, cigarettes, uncomplicated: Secondary | ICD-10-CM | POA: Insufficient documentation

## 2021-04-18 DIAGNOSIS — J9811 Atelectasis: Secondary | ICD-10-CM | POA: Diagnosis not present

## 2021-04-18 DIAGNOSIS — I509 Heart failure, unspecified: Secondary | ICD-10-CM | POA: Insufficient documentation

## 2021-04-18 DIAGNOSIS — Z809 Family history of malignant neoplasm, unspecified: Secondary | ICD-10-CM | POA: Diagnosis not present

## 2021-04-18 DIAGNOSIS — C7931 Secondary malignant neoplasm of brain: Secondary | ICD-10-CM | POA: Diagnosis not present

## 2021-04-18 DIAGNOSIS — J449 Chronic obstructive pulmonary disease, unspecified: Secondary | ICD-10-CM | POA: Diagnosis not present

## 2021-04-18 DIAGNOSIS — R918 Other nonspecific abnormal finding of lung field: Secondary | ICD-10-CM | POA: Diagnosis not present

## 2021-04-18 DIAGNOSIS — I110 Hypertensive heart disease with heart failure: Secondary | ICD-10-CM | POA: Diagnosis not present

## 2021-04-18 DIAGNOSIS — C3411 Malignant neoplasm of upper lobe, right bronchus or lung: Secondary | ICD-10-CM | POA: Insufficient documentation

## 2021-04-18 DIAGNOSIS — Z9889 Other specified postprocedural states: Secondary | ICD-10-CM

## 2021-04-18 DIAGNOSIS — Z8249 Family history of ischemic heart disease and other diseases of the circulatory system: Secondary | ICD-10-CM | POA: Diagnosis not present

## 2021-04-18 DIAGNOSIS — E785 Hyperlipidemia, unspecified: Secondary | ICD-10-CM | POA: Diagnosis not present

## 2021-04-18 DIAGNOSIS — C3491 Malignant neoplasm of unspecified part of right bronchus or lung: Secondary | ICD-10-CM | POA: Diagnosis not present

## 2021-04-18 DIAGNOSIS — R911 Solitary pulmonary nodule: Secondary | ICD-10-CM | POA: Diagnosis not present

## 2021-04-18 DIAGNOSIS — Z888 Allergy status to other drugs, medicaments and biological substances status: Secondary | ICD-10-CM | POA: Insufficient documentation

## 2021-04-18 DIAGNOSIS — C771 Secondary and unspecified malignant neoplasm of intrathoracic lymph nodes: Secondary | ICD-10-CM | POA: Insufficient documentation

## 2021-04-18 HISTORY — DX: Other intervertebral disc degeneration, lumbar region without mention of lumbar back pain or lower extremity pain: M51.369

## 2021-04-18 HISTORY — DX: Homonymous bilateral field defects, left side: H53.462

## 2021-04-18 HISTORY — DX: Chronic obstructive pulmonary disease, unspecified: J44.9

## 2021-04-18 HISTORY — DX: Other intervertebral disc degeneration, lumbar region: M51.36

## 2021-04-18 HISTORY — DX: Thoracic aortic aneurysm, without rupture, unspecified: I71.20

## 2021-04-18 HISTORY — DX: Congenital insufficiency of aortic valve: Q23.1

## 2021-04-18 HISTORY — DX: Disease of pericardium, unspecified: I31.9

## 2021-04-18 HISTORY — DX: Testicular hypofunction: E29.1

## 2021-04-18 HISTORY — DX: Nonrheumatic aortic (valve) stenosis: I35.0

## 2021-04-18 HISTORY — PX: VIDEO BRONCHOSCOPY WITH ENDOBRONCHIAL ULTRASOUND: SHX6177

## 2021-04-18 HISTORY — DX: Thoracic aortic aneurysm, without rupture: I71.2

## 2021-04-18 HISTORY — DX: Hyperlipidemia, unspecified: E78.5

## 2021-04-18 HISTORY — DX: Malignant neoplasm of unspecified part of right bronchus or lung: C34.91

## 2021-04-18 HISTORY — DX: Heart failure, unspecified: I50.9

## 2021-04-18 HISTORY — DX: Atherosclerosis of aorta: I70.0

## 2021-04-18 HISTORY — DX: Insomnia, unspecified: G47.00

## 2021-04-18 HISTORY — DX: Bicuspid aortic valve: Q23.81

## 2021-04-18 HISTORY — PX: VIDEO BRONCHOSCOPY WITH ENDOBRONCHIAL NAVIGATION: SHX6175

## 2021-04-18 HISTORY — DX: Male erectile dysfunction, unspecified: N52.9

## 2021-04-18 LAB — SARS CORONAVIRUS 2 (TAT 6-24 HRS): SARS Coronavirus 2: NEGATIVE

## 2021-04-18 SURGERY — VIDEO BRONCHOSCOPY WITH ENDOBRONCHIAL NAVIGATION
Anesthesia: General

## 2021-04-18 MED ORDER — DEXMEDETOMIDINE (PRECEDEX) IN NS 20 MCG/5ML (4 MCG/ML) IV SYRINGE
PREFILLED_SYRINGE | INTRAVENOUS | Status: DC | PRN
  Administered 2021-04-18: 12 ug via INTRAVENOUS
  Administered 2021-04-18: 8 ug via INTRAVENOUS

## 2021-04-18 MED ORDER — DEXAMETHASONE SODIUM PHOSPHATE 10 MG/ML IJ SOLN
INTRAMUSCULAR | Status: AC
  Filled 2021-04-18: qty 1

## 2021-04-18 MED ORDER — ONDANSETRON HCL 4 MG/2ML IJ SOLN
INTRAMUSCULAR | Status: DC | PRN
  Administered 2021-04-18: 4 mg via INTRAVENOUS

## 2021-04-18 MED ORDER — DEXAMETHASONE SODIUM PHOSPHATE 10 MG/ML IJ SOLN
INTRAMUSCULAR | Status: DC | PRN
  Administered 2021-04-18: 10 mg via INTRAVENOUS

## 2021-04-18 MED ORDER — FENTANYL CITRATE (PF) 100 MCG/2ML IJ SOLN
INTRAMUSCULAR | Status: DC | PRN
  Administered 2021-04-18: 100 ug via INTRAVENOUS

## 2021-04-18 MED ORDER — CHLORHEXIDINE GLUCONATE 0.12 % MT SOLN
OROMUCOSAL | Status: AC
  Filled 2021-04-18: qty 15

## 2021-04-18 MED ORDER — ROCURONIUM BROMIDE 10 MG/ML (PF) SYRINGE
PREFILLED_SYRINGE | INTRAVENOUS | Status: AC
  Filled 2021-04-18: qty 10

## 2021-04-18 MED ORDER — ORAL CARE MOUTH RINSE: 15.0000 mL | Freq: Once | OROMUCOSAL | Status: AC

## 2021-04-18 MED ORDER — LACTATED RINGERS IV SOLN: INTRAVENOUS | Status: DC

## 2021-04-18 MED ORDER — ONDANSETRON HCL 4 MG/2ML IJ SOLN: 4.0000 mg | Freq: Once | INTRAMUSCULAR | Status: DC | PRN

## 2021-04-18 MED ORDER — SUGAMMADEX SODIUM 200 MG/2ML IV SOLN
INTRAVENOUS | Status: DC | PRN
  Administered 2021-04-18: 200 mg via INTRAVENOUS

## 2021-04-18 MED ORDER — ONDANSETRON HCL 4 MG/2ML IJ SOLN
INTRAMUSCULAR | Status: AC
  Filled 2021-04-18: qty 2

## 2021-04-18 MED ORDER — CHLORHEXIDINE GLUCONATE 0.12 % MT SOLN
15.0000 mL | Freq: Once | OROMUCOSAL | Status: AC
  Administered 2021-04-18: 15 mL via OROMUCOSAL

## 2021-04-18 MED ORDER — ROCURONIUM BROMIDE 100 MG/10ML IV SOLN
INTRAVENOUS | Status: DC | PRN
  Administered 2021-04-18: 20 mg via INTRAVENOUS
  Administered 2021-04-18: 40 mg via INTRAVENOUS

## 2021-04-18 MED ORDER — PROPOFOL 10 MG/ML IV BOLUS
INTRAVENOUS | Status: DC | PRN
  Administered 2021-04-18: 200 mg via INTRAVENOUS

## 2021-04-18 MED ORDER — PROPOFOL 10 MG/ML IV BOLUS
INTRAVENOUS | Status: AC
  Filled 2021-04-18: qty 20

## 2021-04-18 MED ORDER — LIDOCAINE HCL (CARDIAC) PF 100 MG/5ML IV SOSY
PREFILLED_SYRINGE | INTRAVENOUS | Status: DC | PRN
  Administered 2021-04-18: 40 mg via INTRAVENOUS

## 2021-04-18 MED ORDER — MIDAZOLAM HCL 2 MG/2ML IJ SOLN
INTRAMUSCULAR | Status: DC | PRN
  Administered 2021-04-18: 2 mg via INTRAVENOUS

## 2021-04-18 MED ORDER — LIDOCAINE HCL (PF) 2 % IJ SOLN
INTRAMUSCULAR | Status: AC
  Filled 2021-04-18: qty 2

## 2021-04-18 MED ORDER — FENTANYL CITRATE (PF) 100 MCG/2ML IJ SOLN: 25.0000 ug | INTRAMUSCULAR | Status: DC | PRN

## 2021-04-18 MED ORDER — DEXMEDETOMIDINE (PRECEDEX) IN NS 20 MCG/5ML (4 MCG/ML) IV SYRINGE
PREFILLED_SYRINGE | INTRAVENOUS | Status: AC
  Filled 2021-04-18: qty 5

## 2021-04-18 MED ORDER — MIDAZOLAM HCL 2 MG/2ML IJ SOLN
INTRAMUSCULAR | Status: AC
  Filled 2021-04-18: qty 2

## 2021-04-18 MED ORDER — FENTANYL CITRATE (PF) 100 MCG/2ML IJ SOLN
INTRAMUSCULAR | Status: AC
  Filled 2021-04-18: qty 2

## 2021-04-18 NOTE — Anesthesia Procedure Notes (Signed)
Procedure Name: Intubation Date/Time: 04/18/2021 12:38 PM Performed by: Jonna Clark, CRNA Pre-anesthesia Checklist: Patient identified, Patient being monitored, Timeout performed, Emergency Drugs available and Suction available Patient Re-evaluated:Patient Re-evaluated prior to induction Oxygen Delivery Method: Circle system utilized Preoxygenation: Pre-oxygenation with 100% oxygen Induction Type: IV induction Ventilation: Mask ventilation without difficulty Laryngoscope Size: 4 and McGraph Grade View: Grade I Tube type: Oral Tube size: 9.0 mm Number of attempts: 1 Airway Equipment and Method: Stylet Placement Confirmation: ETT inserted through vocal cords under direct vision,  positive ETCO2 and breath sounds checked- equal and bilateral Secured at: 22 cm Tube secured with: Tape Dental Injury: Teeth and Oropharynx as per pre-operative assessment

## 2021-04-18 NOTE — H&P (Signed)
Pulmonary Medicine          Date: 04/18/2021,   MRN# [id number] Matthew Kelly 12/03/1955     Admission                  Current    Referring physician: Dr. Ubaldo Glassing   CHIEF COMPLAINT:   Lung mass with hilar and mediastinal lymphadenopathy and distal metastasis   HISTORY OF PRESENT ILLNESS   This is a 65 year old male with a history of anxiety disorder, lifelong smoking history with active tobacco smoking, aortic stenosis status post TAVR in 2014, osteoarthritis, recent findings of lung mass with hilar and mediastinal lymphadenopathy as well as brain MRI with enhancing lesions, also has a background history of COPD erectile dysfunction, hepatitis C status post Harvoni with cure, who came in today for bronchoscopy to obtain tissue for potential lung cancer noted on most recent CT chest imaging.   PAST MEDICAL HISTORY   Past Medical History:  Diagnosis Date  . Anxiety   . Aortic atherosclerosis ([place])   . Aortic stenosis    s/p AVR in 05/2013  . Arthritis   . Bicuspid aortic valve   . Bronchogenic carcinoma of right lung ([place])    MRI confirmed brain metastasis  . CHF (congestive heart failure) ([place])   . COPD (chronic obstructive pulmonary disease) ([place])   . DDD (degenerative disc disease), lumbar   . Dysplastic nevus 12/30/2020   R post waistline - moderate  . Erectile dysfunction   . GERD (gastroesophageal reflux disease)   . Heart murmur   . Hepatitis C    dx'd 1980s.  treated with Harvoni approx 2015  . HLD (hyperlipidemia)   . Homonymous hemianopia, left   . Hypertension   . Hypogonadism in male   . Insomnia   . Pericarditis   . Spinal stenosis, lumbar 07/25/2019   severe  . Squamous cell carcinoma of skin 12/18/2019   SCCIS L lat ankle  . Squamous cell carcinoma of skin 11/07/2008   SCCIS L wrist  . Thoracic aortic aneurysm [facility])    s/p aortic arch/hemi-arch repair in 05/2013     SURGICAL HISTORY   Past Surgical History:  Procedure Laterality  Date  . AORTIC VALVE REPLACEMENT N/A 06/01/2013   Procedure: REPLACEMENT AORTIC VALVE; Surgeon: Gretchen Portela, MD; Location: [facility]PERATING ROOMS; Service: Cardiothoracic; Laterality: N/A; Bioprosthetic 25 mm [employer] trifecta bovine pericardial tissue valve  . CATARACT EXTRACTION W/PHACO Right 12/17/2020   Procedure: CATARACT EXTRACTION PHACO AND INTRAOCULAR LENS PLACEMENT (IOC) RIGHT 6.67 00:49.5;  Surgeon: Birder Robson, MD;  Location: [place];  Service: Ophthalmology;  Laterality: Right;  . COLONOSCOPY WITH PROPOFOL N/A 01/04/2017   Procedure: COLONOSCOPY WITH PROPOFOL;  Surgeon: Garlan Fair, MD;  Location: [facility];  Service: Endoscopy;  Laterality: N/A;  . IR IMAGING GUIDED PORT INSERTION  04/11/2021  . ORCHIECTOMY Left 2000  . REPAIR THORACIC AORTA N/A 06/01/2013   Procedure: REPAIR THORACIC AORTIC ANEURYSM W/TRANSVERSE ARCH GRAFT - HemiArch; Surgeon: Gretchen Portela, MD; Location: [facility]; Service: Cardiothoracic; Laterality: N/A; #24 mm Dacron graft     FAMILY HISTORY   Family History  Problem Relation Age of Onset  . Cancer Mother   . Heart disease Father      SOCIAL HISTORY   Social History   Tobacco Use  . Smoking status: Current Every Day Smoker    Packs/day: 0.50    Types: Cigarettes    Start date: 11/16/1978  .  Smokeless tobacco: Never Used  Vaping Use  . Vaping Use: Never used  Substance Use Topics  . Alcohol use: Yes    Alcohol/week: 3.0 standard drinks    Types: 3 Standard drinks or equivalent per week    Comment: occasional  . Drug use: No     MEDICATIONS    Home Medication:    Current Medication:  Current Facility-Administered Medications:  .  chlorhexidine (PERIDEX) 0.12 % solution 15 mL, 15 mL, Mouth/Throat, Once **OR** MEDLINE mouth rinse, 15 mL, Mouth Rinse, Once, Kephart, Jamse Mead, MD .  lactated ringers infusion, , Intravenous, Continuous, Kephart, Jamse Mead, MD    ALLERGIES    Avelox [moxifloxacin], Lexapro [escitalopram oxalate], Rifampin, and Wellbutrin [bupropion]     REVIEW OF SYSTEMS    Review of Systems:  Gen:  Denies  fever, sweats, chills weigh loss  HEENT: Denies blurred vision, double vision, ear pain, eye pain, hearing loss, nose bleeds, sore throat Cardiac:  No dizziness, chest pain or heaviness, chest tightness,edema Resp:   Denies cough or sputum porduction, shortness of breath,wheezing, hemoptysis,  Gi: Denies swallowing difficulty, stomach pain, nausea or vomiting, diarrhea, constipation, bowel incontinence Gu:  Denies bladder incontinence, burning urine Ext:   Denies Joint pain, stiffness or swelling Skin: Denies  skin rash, easy bruising or bleeding or hives Endoc:  Denies polyuria, polydipsia , polyphagia or weight change Psych:   Denies depression, insomnia or hallucinations   Other:  All other systems negative   VS: There were no vitals taken for this visit.     PHYSICAL EXAM    GENERAL:NAD, no fevers, chills, no weakness no fatigue HEAD: Normocephalic, atraumatic.  EYES: Pupils equal, round, reactive to light. Extraocular muscles intact. No scleral icterus.  MOUTH: Moist mucosal membrane. Dentition intact. No abscess noted.  EAR, NOSE, THROAT: Clear without exudates. No external lesions.  NECK: Supple. No thyromegaly. No nodules. No JVD.  PULMONARY: Diffuse coarse rhonchi right sided +wheezes CARDIOVASCULAR: S1 and S2. Regular rate and rhythm. No murmurs, rubs, or gallops. No edema. Pedal pulses 2+ bilaterally.  GASTROINTESTINAL: Soft, nontender, nondistended. No masses. Positive bowel sounds. No hepatosplenomegaly.  MUSCULOSKELETAL: No swelling, clubbing, or edema. Range of motion full in all extremities.  NEUROLOGIC: Cranial nerves II through XII are intact. No gross focal neurological deficits. Sensation intact. Reflexes intact.  SKIN: No ulceration, lesions, rashes, or cyanosis. Skin warm and dry. Turgor intact.   PSYCHIATRIC: Mood, affect within normal limits. The patient is awake, alert and oriented x 3. Insight, judgment intact.       IMAGING    MR BRAIN W WO CONTRAST  Result Date: 03/27/2021 CLINICAL DATA:  Left homonymous hemianopsia. Headache. Duration of symptoms 6 weeks. EXAM: MRI HEAD WITHOUT AND WITH CONTRAST TECHNIQUE: Multiplanar, multiecho pulse sequences of the brain and surrounding structures were obtained without and with intravenous contrast. CONTRAST:  22mL GADAVIST GADOBUTROL 1 MMOL/ML IV SOLN COMPARISON:  None. FINDINGS: Brain: No sign of acute ischemic infarction. No abnormality affects the brainstem, within the superomedial right cerebellum, there is a 4 mm ring-enhancing lesion axial image 64 consistent with a metastasis. Within the right occipital lobe, there is a necrotic metastasis measuring up to 4.2 cm in diameter with pronounced regional vasogenic edema and mass effect. Broad surface along the superior surface of the tentorium in the posteromedial falx. No evidence of venous invasion of either the torcula or the straight sinus. 6 mm metastasis in the left basal ganglia axial image 89. 5 mm metastasis medial right parietal  lobe axial image 94. 9 mm metastasis lateral posterior frontal region on the left axial image 103. 5 mm metastasis right parietal region axial image 111. 15 mm metastasis left posterior frontal lobe axial image 113. A mm metastasis right lateral parietal lobe axial image 116. 3 mm metastasis medial left parietal lobe axial image 120. 3 mm metastasis medial left parietal lobe axial image 121. 3 mm metastasis right frontal lobe axial image 123. 8 mm metastasis left lateral posterior frontal region axial image 124. 2 mm metastasis left posterior frontal lobe axial image 125. 3 mm metastasis right frontoparietal junction axial image 129. 3 mm metastasis right posterior frontal vertex axial image 139. 2 mm metastasis left posterior frontal vertex axial image 139. This as up  to 16 discrete metastatic lesions. Vasogenic edema and mass effect or most pronounced in the right occipital and parietal region but also present within the left posterior frontal brain. No hydrocephalus. No extra-axial collection. No skull or skull base metastasis. Vascular: Major vessels at the base of the brain show flow. Skull and upper cervical spine: Negative Sinuses/Orbits: Clear except for a retention cyst of the anterior inferior left maxillary sinus. Orbits negative. Other: None IMPRESSION: Total of 16 metastases, 15 supra tentorial and 1 cerebellar. The largest lesion is in the right occipital lobe measuring up to 4.2 cm in diameter, with extensive regional vasogenic edema. This is likely responsible for the visual symptoms. Mass-effect because of that lesion and the associated edema. Vasogenic edema also present in the left posterior frontal lobe associated with the 15 mm metastasis in that region. Many the metastases show necrosis, more notable with increasing lesion size. These results will be called to the ordering clinician or representative by the Radiologist Assistant, and communication documented in the PACS or [employer]. Electronically Signed   By: Nelson Chimes M.D.   On: 03/27/2021 13:04   NM PET Image Initial (PI) Skull Base To Thigh  Result Date: 04/01/2021 CLINICAL DATA:  Initial treatment strategy for unknown primary. EXAM: NUCLEAR MEDICINE PET SKULL BASE TO THIGH TECHNIQUE: 11.0 mCi F-18 FDG was injected intravenously. Full-ring PET imaging was performed from the skull base to thigh after the radiotracer. CT data was obtained and used for attenuation correction and anatomic localization. Fasting blood glucose: 133 mg/dl COMPARISON:  None. FINDINGS: Mediastinal blood pool activity: SUV max 2.4 Liver activity: SUV max NA NECK: No hypermetabolic lymph nodes in the neck. Peripherally metabolic lesion in the RIGHT occipital lobe with a rim of hypermetabolism consistent with brain  metastasis with associated vasogenic edema. (Image 4) Incidental CT findings: none CHEST: 15 mm hypermetabolic nodule in the RIGHT lung apex with SUV max equal 5.3 on image 87. Hypermetabolic RIGHT suprahilar mass measures 3.8 by 3.3 cm and has intense metabolic activity with SUV max equal 9.7. Mass extends into the RIGHT lower paratracheal nodal station. No hypermetabolic contralateral lymph nodes. No hypermetabolic supraclavicular nodes. Incidental CT findings: none ABDOMEN/PELVIS: No abnormal hypermetabolic activity within the liver, pancreas, adrenal glands, or spleen. No hypermetabolic lymph nodes in the abdomen or pelvis. Incidental CT findings: LEFT colon diverticulosis. Atherosclerotic calcification of the aorta. Peripherally calcified LEFT testicle. SKELETON: No focal hypermetabolic activity to suggest skeletal metastasis. Incidental CT findings: none IMPRESSION: 1. Hypermetabolic pulmonary nodule in the medial aspect of the RIGHT lung apex most consistent with primary bronchogenic carcinoma. 2. Metastatic nodal mass to the RIGHT suprahilar nodal station with extension into the RIGHT lower paratracheal nodal station. 3. Brain metastasis identified in the RIGHT occipital  lobe. See recent MRI. Electronically Signed   By: Suzy Bouchard M.D.   On: 04/01/2021 12:42   IR IMAGING GUIDED PORT INSERTION  Result Date: 04/11/2021 INDICATION: 65 year old male with non-small cell lung cancer metastatic to the brain. He presents for port catheter placement to establish durable venous access. EXAM: IMPLANTED PORT A CATH PLACEMENT WITH ULTRASOUND AND FLUOROSCOPIC GUIDANCE MEDICATIONS: None. ANESTHESIA/SEDATION: Versed 2 mg IV; Fentanyl 100 mcg IV; Moderate Sedation Time:  20 minutes The patient was continuously monitored during the procedure by the interventional radiology nurse under my direct supervision. FLUOROSCOPY TIME:  0 minutes, 54 seconds (6.3 mGy) COMPLICATIONS: None immediate. PROCEDURE: The right neck  and chest was prepped with chlorhexidine, and draped in the usual sterile fashion using maximum barrier technique (cap and mask, sterile gown, sterile gloves, large sterile sheet, hand hygiene and cutaneous antiseptic). Local anesthesia was attained by infiltration with 1% lidocaine with epinephrine. Ultrasound demonstrated patency of the right internal jugular vein, and this was documented with an image. Under real-time ultrasound guidance, this vein was accessed with a 21 gauge micropuncture needle and image documentation was performed. A small dermatotomy was made at the access site with an 11 scalpel. A 0.018" wire was advanced into the SVC and the access needle exchanged for a 2F micropuncture vascular sheath. The 0.018" wire was then removed and a 0.035" wire advanced into the IVC. An appropriate location for the subcutaneous reservoir was selected below the clavicle and an incision was made through the skin and underlying soft tissues. The subcutaneous tissues were then dissected using a combination of blunt and sharp surgical technique and a pocket was formed. A single lumen power injectable portacatheter was then tunneled through the subcutaneous tissues from the pocket to the dermatotomy and the port reservoir placed within the subcutaneous pocket. The venous access site was then serially dilated and a peel away vascular sheath placed over the wire. The wire was removed and the port catheter advanced into position under fluoroscopic guidance. The catheter tip is positioned in the superior cavoatrial junction. This was documented with a spot image. The portacatheter was then tested and found to flush and aspirate well. The port was flushed with saline followed by 100 units/mL heparinized saline. The pocket was then closed in two layers using first subdermal inverted interrupted absorbable sutures followed by a running subcuticular suture. The epidermis was then sealed with Dermabond. The dermatotomy at the  venous access site was also closed with Dermabond. IMPRESSION: Successful placement of a right IJ approach Power Port with ultrasound and fluoroscopic guidance. The catheter is ready for use. Electronically Signed   By: Jacqulynn Cadet M.D.   On: 04/11/2021 11:21   CT Super D Chest Wo Contrast  Result Date: 04/16/2021 CLINICAL DATA:  Upcoming endobronchial navigational biopsy on 06/03. Follow-up of previous PET evaluation from Mar 31, 2021 w. EXAM: CT CHEST WITHOUT CONTRAST TECHNIQUE: Multidetector CT imaging of the chest was performed using thin slice collimation for electromagnetic bronchoscopy planning purposes, without intravenous contrast. COMPARISON:  Mar 31, 2021 FINDINGS: Cardiovascular: Post median sternotomy. Calcified atheromatous plaque of the thoracic aorta signs of coronary artery calcification RIGHT and LEFT coronary circulation. Heart size stable without substantial pericardial fluid. Central pulmonary vasculature is of normal caliber. RIGHT-sided Port-A-Cath terminates in the upper RIGHT atrium. Limited assessment of cardiovascular structures given lack of intravenous contrast. Mediastinum/Nodes: Esophagus grossly normal. Bulky RIGHT hilar adenopathy shown to be hypermetabolic measures approximately 3.9 x 3.5 cm. No enlarged lymph nodes elsewhere in the  chest. No axillary lymphadenopathy. No thoracic inlet lymphadenopathy. Lungs/Pleura: Irregular nodule in the RIGHT upper lobe (image 36/3) 1.7 x 1.2 cm similar to recent PET evaluation,z better displayed on current CT evaluation. Small nodule in the RIGHT middle lobe (image 122/3) 5 mm. Parenchymal distortion in the RIGHT lower lobe with septal thickening and associated ground-glass measuring up to 3.9 x 2.0 cm when measured in a similar fashion this measured approximately 3.7 x 1.8 cm on the study of June of 2020 LEFT chest is clear. Airways are patent aside from narrowing of RIGHT upper lobe bronchus by the RIGHT hilar mass. Upper Abdomen:  Incidental imaging of upper abdominal contents without acute process. The adrenal glands are normal. Musculoskeletal: Visualized clavicles and scapulae are unremarkable. Post median sternotomy for CABG. No destructive bone finding or acute bone process. Spinal degenerative changes. IMPRESSION: 1. Irregular RIGHT upper lobe nodule and RIGHT hilar adenopathy compatible with bronchogenic neoplasm as seen on recent PET-CT. 2. New nodule since June of 2020 in the RIGHT middle lobe and stable to slightly increased size of irregular area of nodularity and septal thickening in the RIGHT lower lobe could represent multifocal bronchogenic neoplasm, not shown to be hypermetabolic on the previous PET exam above mediastinal blood pool. Attention on follow-up. 3. Aortic atherosclerosis. Aortic Atherosclerosis (ICD10-I70.0). Electronically Signed   By: Zetta Bills M.D.   On: 04/16/2021 11:47   CLINICAL DATA:  Upcoming endobronchial navigational biopsy on 06/03. Follow-up of previous PET evaluation from Mar 31, 2021 w.  EXAM: CT CHEST WITHOUT CONTRAST  TECHNIQUE: Multidetector CT imaging of the chest was performed using thin slice collimation for electromagnetic bronchoscopy planning purposes, without intravenous contrast.  COMPARISON:  Mar 31, 2021  FINDINGS: Cardiovascular: Post median sternotomy. Calcified atheromatous plaque of the thoracic aorta signs of coronary artery calcification RIGHT and LEFT coronary circulation. Heart size stable without substantial pericardial fluid. Central pulmonary vasculature is of normal caliber. RIGHT-sided Port-A-Cath terminates in the upper RIGHT atrium. Limited assessment of cardiovascular structures given lack of intravenous contrast.  Mediastinum/Nodes: Esophagus grossly normal.  Bulky RIGHT hilar adenopathy shown to be hypermetabolic measures approximately 3.9 x 3.5 cm. No enlarged lymph nodes elsewhere in the chest. No axillary lymphadenopathy. No  thoracic inlet lymphadenopathy.  Lungs/Pleura: Irregular nodule in the RIGHT upper lobe (image 36/3) 1.7 x 1.2 cm similar to recent PET evaluation,z better displayed on current CT evaluation.  Small nodule in the RIGHT middle lobe (image 122/3) 5 mm.  Parenchymal distortion in the RIGHT lower lobe with septal thickening and associated ground-glass measuring up to 3.9 x 2.0 cm when measured in a similar fashion this measured approximately 3.7 x 1.8 cm on the study of June of 2020 LEFT chest is clear. Airways are patent aside from narrowing of RIGHT upper lobe bronchus by the RIGHT hilar mass.  Upper Abdomen: Incidental imaging of upper abdominal contents without acute process. The adrenal glands are normal.  Musculoskeletal: Visualized clavicles and scapulae are unremarkable. Post median sternotomy for CABG. No destructive bone finding or acute bone process. Spinal degenerative changes.  IMPRESSION: 1. Irregular RIGHT upper lobe nodule and RIGHT hilar adenopathy compatible with bronchogenic neoplasm as seen on recent PET-CT. 2. New nodule since June of 2020 in the RIGHT middle lobe and stable to slightly increased size of irregular area of nodularity and septal thickening in the RIGHT lower lobe could represent multifocal bronchogenic neoplasm, not shown to be hypermetabolic on the previous PET exam above mediastinal blood pool. Attention on follow-up. 3. Aortic atherosclerosis.  Aortic Atherosclerosis (ICD10-I70.0).   Electronically Signed   By: Zetta Bills M.D.   On: 04/16/2021 11:47   ASSESSMENT/PLAN   Right upper lobe lung mass with hilar and mediastinal lymphadenopathy -Patient presents today for navigational bronchoscopy and endobronchial ultrasound assisted lymph node biopsy We have discussed procedure and findings patient has no new symptoms or physical exam findings and we will proceed as planned.   -Reviewed risks/complications and benefits with  patient, risks include infection, pneumothorax/pneumomediastinum which may require chest tube placement as well as overnight/prolonged hospitalization and possible mechanical ventilation. Other risks include bleeding and very rarely death.  Patient understands risks and wishes to proceed.  Additional questions were answered, and patient is aware that post procedure patient will be going home with family and may experience cough with possible clots on expectoration as well as phlegm which may last few days as well as hoarseness of voice post intubation and mechanical ventilation.    Thank you for allowing me to participate in the care of this patient.  Total face to face encounter time for this patient visit was > 45 min. >50% of the time was  spent in counseling and coordination of care.   Patient/Family are satisfied with care plan and all questions have been answered.  This document was prepared using Dragon voice recognition software and may include unintentional dictation errors.     [name], M.D.  Division of [place]

## 2021-04-18 NOTE — Anesthesia Postprocedure Evaluation (Signed)
Anesthesia Post Note  Patient: Matthew Kelly  Procedure(s) Performed: VIDEO BRONCHOSCOPY WITH ENDOBRONCHIAL NAVIGATION (N/A ) VIDEO BRONCHOSCOPY WITH ENDOBRONCHIAL ULTRASOUND (N/A )  Patient location during evaluation: PACU Anesthesia Type: General Level of consciousness: awake and alert and oriented Pain management: pain level controlled Vital Signs Assessment: post-procedure vital signs reviewed and stable Respiratory status: spontaneous breathing, nonlabored ventilation and respiratory function stable Cardiovascular status: blood pressure returned to baseline and stable Postop Assessment: no signs of nausea or vomiting Anesthetic complications: no   No complications documented.   Last Vitals:  Vitals:   04/18/21 1445 04/18/21 1455  BP: 139/77 (!) 156/78  Pulse: 65 66  Resp: (!) 21 16  Temp:  36.5 C  SpO2: 91% 96%    Last Pain:  Vitals:   04/18/21 1455  TempSrc: Temporal  PainSc: 0-No pain                 Gila Lauf

## 2021-04-18 NOTE — Anesthesia Preprocedure Evaluation (Signed)
Anesthesia Evaluation  Patient identified by MRN, date of birth, ID band Patient awake    Reviewed: Allergy & Precautions, H&P , NPO status , Patient's Chart, lab work & pertinent test results, reviewed documented beta blocker date and time   Airway Mallampati: II  TM Distance: >3 FB Neck ROM: full    Dental  (+) Teeth Intact   Pulmonary COPD, Current Smoker and Patient abstained from smoking.,    Pulmonary exam normal        Cardiovascular hypertension, negative cardio ROS Normal cardiovascular exam Rhythm:regular Rate:Normal     Neuro/Psych Anxiety negative neurological ROS  negative psych ROS   GI/Hepatic GERD  Medicated,(+) Hepatitis -  Endo/Other  negative endocrine ROS  Renal/GU negative Renal ROS  negative genitourinary   Musculoskeletal   Abdominal   Peds  Hematology negative hematology ROS (+)   Anesthesia Other Findings Past Medical History: No date: Anxiety No date: Aortic atherosclerosis ([place]) No date: Aortic stenosis     Comment:  s/p AVR in 05/2013 No date: Arthritis No date: Bicuspid aortic valve No date: Bronchogenic carcinoma of right lung (HCC)     Comment:  MRI confirmed brain metastasis No date: CHF (congestive heart failure) (HCC) No date: COPD (chronic obstructive pulmonary disease) (HCC) No date: DDD (degenerative disc disease), lumbar 12/30/2020: Dysplastic nevus     Comment:  R post waistline - moderate No date: Erectile dysfunction No date: GERD (gastroesophageal reflux disease) No date: Heart murmur No date: Hepatitis C     Comment:  dx'd 1980s.  treated with Harvoni approx 2015 No date: HLD (hyperlipidemia) No date: Homonymous hemianopia, left No date: Hypertension No date: Hypogonadism in male No date: Insomnia No date: Pericarditis 07/25/2019: Spinal stenosis, lumbar     Comment:  severe 12/18/2019: Squamous cell carcinoma of skin     Comment:  SCCIS L lat  ankle 11/07/2008: Squamous cell carcinoma of skin     Comment:  SCCIS L wrist No date: Thoracic aortic aneurysm [facility])     Comment:  s/p aortic arch/hemi-arch repair in 05/2013 Past Surgical History: 06/01/2013: AORTIC VALVE REPLACEMENT; N/A     Comment:  Procedure: REPLACEMENT AORTIC VALVE; Surgeon: Gretchen Portela, MD; Location: [facility]PERATING [facility]S;               Service: Cardiothoracic; Laterality: N/A; Bioprosthetic               25 mm [employer] Jude trifecta bovine pericardial tissue valve 12/17/2020: CATARACT EXTRACTION W/PHACO; Right     Comment:  Procedure: CATARACT EXTRACTION PHACO AND INTRAOCULAR               LENS PLACEMENT ([place]) RIGHT 6.67 00:49.5;  Surgeon:               Birder Robson, MD;  Location: [facility];                Service: Ophthalmology;  Laterality: Right; 01/04/2017: COLONOSCOPY WITH PROPOFOL; N/A     Comment:  Procedure: COLONOSCOPY WITH PROPOFOL;  Surgeon: Garlan Fair, MD;  Location: [facility];  Service:               Endoscopy;  Laterality: N/A; 04/11/2021: IR IMAGING GUIDED PORT INSERTION 2000: ORCHIECTOMY; Left 06/01/2013: REPAIR THORACIC AORTA; N/A     Comment:  Procedure: REPAIR THORACIC  AORTIC ANEURYSM W/TRANSVERSE               ARCH GRAFT - HemiArch; Surgeon: Gretchen Portela,              MD; Location: [facility]; Service:               Cardiothoracic; Laterality: N/A; #24 mm Dacron graft BMI    Body Mass Index: 24.59 kg/m     Reproductive/Obstetrics negative OB ROS                             Anesthesia Physical Anesthesia Plan  ASA: III  Anesthesia Plan: General ETT   Post-op Pain Management:    Induction:   PONV Risk Score and Plan: 2  Airway Management Planned:   Additional Equipment:   Intra-op Plan:   Post-operative Plan:   Informed Consent: I have reviewed the patients History and Physical, chart, labs and discussed the procedure  including the risks, benefits and alternatives for the proposed anesthesia with the patient or authorized representative who has indicated his/her understanding and acceptance.     Dental Advisory Given  Plan Discussed with: CRNA  Anesthesia Plan Comments:         Anesthesia Quick Evaluation

## 2021-04-18 NOTE — Discharge Instructions (Signed)
Flexible Bronchoscopy  Flexible bronchoscopy is a procedure used to examine the passageways in the lungs. During the procedure, a thin, flexible tool with a camera (bronchoscope) is passed into the mouth or nose, down through the windpipe (trachea), and into the air tubes in the lungs (bronchi). This tool allows the health care provider to look inside the lungs and to take samples for testing, if needed. Tell a health care provider about:  Any allergies you have.  All medicines you are taking, including vitamins, herbs, eye drops, creams, and over-the-counter medicines.  Any problems you or family members have had with anesthetic medicines.  Any blood disorders you have.  Any surgeries you have had.  Any medical conditions you have.  Whether you are pregnant or may be pregnant. What are the risks? Generally, this is a safe procedure. However, problems may occur, including:  Infection.  Bleeding.  Damage to other structures or organs.  Allergic reactions to medicines.  Collapsed lung (pneumothorax).  Increased need for oxygen or difficulty breathing after the procedure. What happens before the procedure? Staying hydrated Follow instructions from your health care provider about hydration, which may include:  Up to 2 hours before the procedure - you may continue to drink clear liquids, such as water, clear fruit juice, black coffee, and plain tea.   Eating and drinking restrictions Follow instructions from your health care provider about eating and drinking, which may include:  8 hours before the procedure - stop eating heavy meals or foods, such as meat, fried foods, or fatty foods.  6 hours before the procedure - stop eating light meals or foods, such as toast or cereal.  6 hours before the procedure - stop drinking milk or drinks that contain milk.  2 hours before the procedure - stop drinking clear liquids. Medicines Ask your health care provider about:  Changing or  stopping your regular medicines. This is especially important if you are taking diabetes medicines or blood thinners.  Taking medicines such as aspirin and ibuprofen. These medicines can thin your blood. Do not take these medicines unless your health care provider tells you to take them.  Taking over-the-counter medicines, vitamins, herbs, and supplements. General instructions  You may be given antibiotic medicine to help lower the risk of infection.  Plan to have a responsible adult take you home from the hospital or clinic.  If you will be going home right after the procedure, plan to have a responsible adult care for you for the time you are told. This is important. What happens during the procedure?  An IV will be inserted into one of your veins.  You will be given a medicine (local anesthetic) to numb your mouth, nose, throat, and voice box (larynx). You may also be given one or more of the following: ? A medicine to help you relax (sedative). ? A medicine to control coughing. ? A medicine to dry up any fluids or secretions in your lungs.  A bronchoscope will be passed into your nose or mouth, and into your lungs. Your health care provider will examine your lungs.  Samples of airway secretions may be collected for testing.  If abnormal areas are seen in your airways, samples of tissue may be removed and checked under a microscope (biopsy).  If tissue samples are needed from the outer parts of the lung, a type of X-ray (fluoroscopy) may be used to guide the bronchoscope to these areas.  If bleeding occurs, you may be given medicine to  stop or decrease the bleeding. The procedure may vary among health care providers and hospitals. What can I expect after the procedure?  Your blood pressure, heart rate, breathing rate, and blood oxygen level will be monitored until you leave the hospital or clinic.  You may have a chest X-ray to check for signs of pneumothorax.  You willnot be  allowed to eat or drink anything for 2 hours after your procedure.  If a biopsy was taken, it is up to you to get the results of the test. Ask your health care provider, or the department that is doing the procedure, when your results will be ready.  You may have the following symptoms for 24-48 hours: ? A cough that is worse than it was before the procedure. ? A low-grade fever. ? A sore throat or hoarse voice. ? Some blood in the mucus from your lungs (sputum), if a biopsy was done. Follow these instructions at home: Eating and drinking  Do not eat or drink anything, including water, for 2 hours after your procedure, or until your numbing medicine has worn off. Having a numb throat increases your risk of burning yourself or choking.  Start eating soft foods and slowly drinking liquids after your numbness is gone and your cough and gag reflexes have returned.  You may return to your normal diet the day after the procedure. Driving  If you were given a sedative during the procedure, it can affect you for several hours. Do not drive or operate machinery until your health care provider says that it is safe.  Ask your health care provider if the medicine prescribed to you requires you to avoid driving or using machinery.  Return to your normal activities as told by your health care provider. Ask your health care provider what activities are safe for you. General instructions  Take over-the-counter and prescription medicines only as told by your health care provider.  Do not use any products that contain nicotine or tobacco. These products include cigarettes, chewing tobacco, and vaping devices, such as e-cigarettes. If you need help quitting, ask your health care provider.  Keep all follow-up visits. This is important.   Get help right away if:  You have shortness of breath that gets worse.  You become light-headed or feel like you might faint.  You have chest pain.  You cough up  more than a small amount of blood. These symptoms may represent a serious problem that is an emergency. Do not wait to see if the symptoms will go away. Get medical help right away. Call your local emergency services (911 in the [place] Do not drive yourself to the hospital. Summary  Flexible bronchoscopy is a procedure that allows your health care provider to look closely inside your lungs and to take testing samples if needed.  Risks of flexible bronchoscopy include bleeding, infection, and collapsed lung (pneumothorax).  Before the procedure, you will be given a medicine to numb your mouth, nose, throat, and voice box. Then, a bronchoscope will be passed into your nose or mouth, and into your lungs.  After the procedure, your blood pressure, heart rate, breathing rate, and blood oxygen level will be monitored until you leave the hospital or clinic. You may have a chest X-ray to check for signs of pneumothorax.  You will not be allowed to eat or drink anything for 2 hours after your procedure. This information is not intended to replace advice given to you by your health care provider.  Make sure you discuss any questions you have with your health care provider. Document Revised: 05/23/2020 Document Reviewed: 05/23/2020 Elsevier Patient Education  2021 [employer]   1) The drugs that you were given will stay in your system until tomorrow so for the next 24 hours you should not:  A) Drive an automobile B) Make any legal decisions C) Drink any alcoholic beverage   2) You may resume regular meals tomorrow.  Today it is better to start with liquids and gradually work up to solid foods.  You may eat anything you prefer, but it is better to start with liquids, then soup and crackers, and gradually work up to solid foods.   3) Please notify your doctor immediately if you have any unusual bleeding, trouble breathing, redness and pain at the  surgery site, drainage, fever, or pain not relieved by medication.    4) Additional Instructions:        Please contact your physician with any problems or Same Day Surgery at [phone], Monday through Friday 6 am to 4 pm, or [place] at [facility] number at [phone].

## 2021-04-18 NOTE — Procedures (Signed)
ELECTROMAGNETIC NAVIGATIONAL BRONCHOSCOPY PROCEDURE NOTE  FIBEROPTIC BRONCHOSCOPY WITH BRONCHOALVEOLAR LAVAGE PROCEDURE NOTE  ENDOBRONCHIAL ULTRASOUND PROCEDURE NOTE    Flexible bronchoscopy was performed  by : Matthew Gins MD  assistance by : 1)Repiratory therapist  and 2)[employer] staff and 3) Anesthesia team and 4) Flouroscopy team   Indication for the procedure was :  Pre-procedural H&P. The following assessment was performed on the day of the procedure prior to initiating sedation History:  Chest pain n Dyspnea y Hemoptysis n Cough y Fever n Other pertinent items n  Examination Vital signs -reviewed as per nursing documentation today Cardiac    Murmurs: n  Rubs : n  Gallop: n Lungs Wheezing: n Rales : n Rhonchi :y  Other pertinent findings: SOB/hypoxemia due to chronic lung disease   Pre-procedural assessment for Procedural Sedation included: Depth of sedation: As per anesthesia team  ASA Classification:  2 Mallampati airway assessment: 3    Medication list reviewed: y  The patient's interval history was taken and revealed: no new complaints The pre- procedure physical examination revealed: No new findings Refer to prior clinic note for details.  Informed Consent: Informed consent was obtained from:  patient after explanation of procedure and risks, benefits, as well as alternative procedures available.  Explanation of level of sedation and possible transfusion was also provided.    Procedural Preparation: Time out was performed and patient was identified by name and birthdate and procedure to be performed and side for sampling, if any, was specified. Pt was intubated by anesthesia.  The patient was appropriately draped.   Fiberoptic bronchoscopy with airway inspection and BAL Procedure findings:  Bronchoscope was inserted via ETT  without difficulty.  Posterior oropharynx, epiglottis, arytenoids, false cords and vocal cords were not visualized as  these were bypassed by endotracheal tube. The distal trachea was normal in circumference and appearance without mucosal, cartilaginous or branching abnormalities.  The main carina was mildly splayed . All right and left lobar airways were visualized to the Subsegmental level.  Sub- sub segmental carinae were identified in all the distal airways.   Secretions were visible in the following airways and appeared to be clear.  The mucosa was : friable at RUL  Airways were notable for:        exophytic lesions :n       extrinsic compression in the following distributions: n.       Friable mucosa: y       Neurosurgeon /pigmentation: n     Post procedure Diagnosis:   Mass-effect and extrinsic compression of right upper lobe apical segment     Electromagnetic Navigational Bronchoscopy Procedure Findings:  After appropriate CT-guided planning ENB scope was advanced via endotracheal tube and LG was advanced for registration.  Post appropriate planning and registration peripheral navigation was used to visualize target lesion.    Cytobrush x3 - lesional carcinoma Transbronchial needle biopsy at right upper lobe x3-lesional carcinoma Forceps biopsy right upper lobe x4-lesional carcinoma  Post procedure diagnosis: Lung cancer      Endobronchial ultrasound assisted hilar and mediastinal lymph node biopsies procedure findings: The fiberoptic bronchoscope was removed and the EBUS scope was introduced. Examination began to evaluate for pathologically enlarged lymph nodes starting on the left side progressing to the right side.  All lymph node biopsies performed with 21-gauge needle. Lymph node biopsies were sent in cytolite for all stations.  Station for L was measured to be 0.7 cm and was not biopsied Station 10 L was measured to  be 0.6 cm and was not biopsied Station 7 was measured to be 2.1 cm biopsied 4 times- no atypia found with good lymphoid shedding Station 10 R was measured to be  1.4 cm- lesional atypia found carcinoma Station 4R- measured at 1.6 cm-lesional atypia found consistent with carcinoma   Post procedure diagnosis: Metastatic carcinoma   Specimens obtained included:                                                Cytology brushes : RUL  Broncho-alveolar lavage site:RUl   sent for cytology                              47m volume infused 15 ml volume returned with cellular clotted derbirs appearance   Fluoroscopy Used: 1.242m;      Immediate sampling complications included:none  Epinephrine none ml was used topically  The bronchoscopy was terminated due to completion of the planned procedure and the bronchoscope was removed.   Total dosage of Lidocaine was zero mg Total fluoroscopy time was 1.5  minutes   Estimated Blood loss: 10 cc.  Complications included:  none immediate   Disposition:  Home with wife Matthew Kelly I met with wife and reviewed findings.   Follow up with Dr. AlLanney Kelly 5 days for result discussion.     FuOttie Kelly  [facility]

## 2021-04-18 NOTE — Transfer of Care (Signed)
Immediate Anesthesia Transfer of Care Note  Patient: Matthew Kelly  Procedure(s) Performed: VIDEO BRONCHOSCOPY WITH ENDOBRONCHIAL NAVIGATION (N/A ) VIDEO BRONCHOSCOPY WITH ENDOBRONCHIAL ULTRASOUND (N/A )  Patient Location: PACU  Anesthesia Type:General  Level of Consciousness: drowsy and patient cooperative  Airway & Oxygen Therapy: Patient Spontanous Breathing and Patient connected to face mask oxygen  Post-op Assessment: Report given to RN and Post -op Vital signs reviewed and stable  Post vital signs: Reviewed and stable  Last Vitals:  Vitals Value Taken Time  BP 132/66 04/18/21 1402  Temp 36.1 C 04/18/21 1402  Pulse 66 04/18/21 1412  Resp 16 04/18/21 1412  SpO2 96 % 04/18/21 1412  Vitals shown include unvalidated device data.  Last Pain:  Vitals:   04/18/21 1121  TempSrc: Tympanic  PainSc: 0-No pain         Complications: No complications documented.

## 2021-04-21 ENCOUNTER — Ambulatory Visit
Admission: RE | Admit: 2021-04-21 | Discharge: 2021-04-21 | Disposition: A | Discharge status: Home / Self Care | Payer: Medicare Other | Source: Ambulatory Visit | Attending: Radiation Oncology | Admitting: Radiation Oncology

## 2021-04-21 ENCOUNTER — Encounter: Payer: Self-pay | Admitting: *Deleted

## 2021-04-21 ENCOUNTER — Encounter: Payer: Self-pay | Admitting: Pulmonary Disease

## 2021-04-21 ENCOUNTER — Telehealth: Payer: Self-pay | Admitting: *Deleted

## 2021-04-21 DIAGNOSIS — C7931 Secondary malignant neoplasm of brain: Secondary | ICD-10-CM | POA: Diagnosis not present

## 2021-04-21 DIAGNOSIS — R918 Other nonspecific abnormal finding of lung field: Secondary | ICD-10-CM | POA: Diagnosis not present

## 2021-04-21 NOTE — Telephone Encounter (Signed)
Patient called requesting a return call to go over the side effects of his treatment.

## 2021-04-21 NOTE — Progress Notes (Signed)
FMLA forms received for pt's spouse. Will give to RadOnc RN to complete since is requesting FMLA for radiation treatments. Will follow up at pt's next clinic visit on 6/14. Nothing further needed at this time.

## 2021-04-22 ENCOUNTER — Ambulatory Visit
Admission: RE | Admit: 2021-04-22 | Discharge: 2021-04-22 | Disposition: A | Discharge status: Home / Self Care | Payer: Medicare Other | Source: Ambulatory Visit | Attending: Radiation Oncology | Admitting: Radiation Oncology

## 2021-04-22 DIAGNOSIS — C7931 Secondary malignant neoplasm of brain: Secondary | ICD-10-CM | POA: Diagnosis not present

## 2021-04-22 DIAGNOSIS — R918 Other nonspecific abnormal finding of lung field: Secondary | ICD-10-CM | POA: Diagnosis not present

## 2021-04-23 ENCOUNTER — Other Ambulatory Visit: Payer: Self-pay | Admitting: Pulmonary Disease

## 2021-04-23 ENCOUNTER — Ambulatory Visit
Admission: RE | Admit: 2021-04-23 | Discharge: 2021-04-23 | Disposition: A | Discharge status: Home / Self Care | Payer: Medicare Other | Source: Ambulatory Visit | Attending: Radiation Oncology | Admitting: Radiation Oncology

## 2021-04-23 DIAGNOSIS — C3402 Malignant neoplasm of left main bronchus: Secondary | ICD-10-CM | POA: Diagnosis not present

## 2021-04-23 DIAGNOSIS — R918 Other nonspecific abnormal finding of lung field: Secondary | ICD-10-CM | POA: Diagnosis not present

## 2021-04-23 DIAGNOSIS — C7931 Secondary malignant neoplasm of brain: Secondary | ICD-10-CM | POA: Diagnosis not present

## 2021-04-23 LAB — CYTOLOGY - NON PAP

## 2021-04-23 LAB — SURGICAL PATHOLOGY

## 2021-04-24 ENCOUNTER — Ambulatory Visit
Admission: RE | Admit: 2021-04-24 | Discharge: 2021-04-24 | Disposition: A | Discharge status: Home / Self Care | Payer: Medicare Other | Source: Ambulatory Visit | Attending: Radiation Oncology | Admitting: Radiation Oncology

## 2021-04-24 DIAGNOSIS — C7931 Secondary malignant neoplasm of brain: Secondary | ICD-10-CM | POA: Diagnosis not present

## 2021-04-24 DIAGNOSIS — R918 Other nonspecific abnormal finding of lung field: Secondary | ICD-10-CM | POA: Diagnosis not present

## 2021-04-25 ENCOUNTER — Ambulatory Visit
Admission: RE | Admit: 2021-04-25 | Discharge: 2021-04-25 | Disposition: A | Discharge status: Home / Self Care | Payer: Medicare Other | Source: Ambulatory Visit | Attending: Radiation Oncology | Admitting: Radiation Oncology

## 2021-04-25 DIAGNOSIS — R918 Other nonspecific abnormal finding of lung field: Secondary | ICD-10-CM | POA: Diagnosis not present

## 2021-04-25 DIAGNOSIS — C7931 Secondary malignant neoplasm of brain: Secondary | ICD-10-CM | POA: Diagnosis not present

## 2021-04-25 NOTE — Progress Notes (Signed)
[place]  [phone] [phone]  ID: Enedina Finner OB: October 26, 1956  MR#: [id number]  IRJ#:[id number]  Patient Care Team: Josetta Huddle, MD as PCP - General (Internal Medicine) Garlan Fair, MD as Consulting Physician (Gastroenterology) Telford Nab, RN as Oncology Nurse Navigator [name], MD as Consulting Physician (Pulmonary Disease)  CHIEF COMPLAINT: Stage IVA adenocarcinoma of the lung with brain metastasis.  INTERVAL HISTORY: Patient returns to clinic today for further evaluation, discussion of his biopsy results, and treatment planning.  He has multiple complaints that are likely secondary to steroids including insomnia, peripheral edema, and weight gain secondary to increased appetite.  His visual changes have improved since initiating XRT.  He is tolerating treatments well.  He has no neurologic complaints.   He denies any recent fevers or illnesses.  He has no chest pain, shortness of breath, cough, or hemoptysis.  He denies any nausea, vomiting, constipation, or diarrhea.  He has no urinary complaints.  Patient offers no further specific complaints today.  REVIEW OF SYSTEMS:   Review of Systems  Constitutional: Negative.  Negative for fever, malaise/fatigue and weight loss.  Eyes: Negative.  Negative for blurred vision.  Respiratory: Negative.  Negative for cough, hemoptysis and shortness of breath.   Cardiovascular:  Positive for leg swelling. Negative for chest pain.  Gastrointestinal: Negative.  Negative for abdominal pain.  Genitourinary: Negative.  Negative for dysuria.  Musculoskeletal: Negative.  Negative for back pain.  Skin: Negative.  Negative for rash.  Neurological: Negative.  Negative for focal weakness, weakness and headaches.  Psychiatric/Behavioral:  The patient has insomnia. The patient is not nervous/anxious.    As per HPI. Otherwise, a complete review of systems is negative.  PAST MEDICAL  HISTORY: Past Medical History:  Diagnosis Date   Anxiety    Aortic atherosclerosis ([place])    Aortic stenosis    s/p AVR in 05/2013   Arthritis    Bicuspid aortic valve    Bronchogenic carcinoma of right lung ([place])    MRI confirmed brain metastasis   CHF (congestive heart failure) (HCC)    COPD (chronic obstructive pulmonary disease) (HCC)    DDD (degenerative disc disease), lumbar    Dysplastic nevus 12/30/2020   R post waistline - moderate   Erectile dysfunction    GERD (gastroesophageal reflux disease)    Heart murmur    Hepatitis C    dx'd 1980s.  treated with Harvoni approx 2015   HLD (hyperlipidemia)    Homonymous hemianopia, left    Hypertension    Hypogonadism in male    Insomnia    Pericarditis    Spinal stenosis, lumbar 07/25/2019   severe   Squamous cell carcinoma of skin 12/18/2019   SCCIS L lat ankle   Squamous cell carcinoma of skin 11/07/2008   SCCIS L wrist   Thoracic aortic aneurysm [facility])    s/p aortic arch/hemi-arch repair in 05/2013    PAST SURGICAL HISTORY: Past Surgical History:  Procedure Laterality Date   AORTIC VALVE REPLACEMENT N/A 06/01/2013   Procedure: REPLACEMENT AORTIC VALVE; Surgeon: Gretchen Portela, MD; Location: [facility]; Service: Cardiothoracic; Laterality: N/A; Bioprosthetic 25 mm [employer] trifecta bovine pericardial tissue valve   CATARACT EXTRACTION W/PHACO Right 12/17/2020   Procedure: CATARACT EXTRACTION PHACO AND INTRAOCULAR LENS PLACEMENT ([place]) RIGHT 6.67 00:49.5;  Surgeon: Birder Robson, MD;  Location: [facility];  Service: Ophthalmology;  Laterality: Right;   COLONOSCOPY WITH PROPOFOL N/A 01/04/2017   Procedure: COLONOSCOPY WITH PROPOFOL;  Surgeon: Garlan Fair, MD;  Location: [facility];  Service: Endoscopy;  Laterality: N/A;   IR IMAGING GUIDED PORT INSERTION  04/11/2021   ORCHIECTOMY Left 2000   REPAIR THORACIC AORTA N/A 06/01/2013   Procedure: REPAIR THORACIC AORTIC ANEURYSM W/TRANSVERSE  ARCH GRAFT - HemiArch; Surgeon: Gretchen Portela, MD; Location: [facility]; Service: Cardiothoracic; Laterality: N/A; #24 mm Dacron graft   VIDEO BRONCHOSCOPY WITH ENDOBRONCHIAL NAVIGATION N/A 04/18/2021   Procedure: VIDEO BRONCHOSCOPY WITH ENDOBRONCHIAL NAVIGATION;  Surgeon: [name], MD;  Location: [facility];  Service: Thoracic;  Laterality: N/A;   VIDEO BRONCHOSCOPY WITH ENDOBRONCHIAL ULTRASOUND N/A 04/18/2021   Procedure: VIDEO BRONCHOSCOPY WITH ENDOBRONCHIAL ULTRASOUND;  Surgeon: [name], MD;  Location: [facility];  Service: Thoracic;  Laterality: N/A;    FAMILY HISTORY: Family History  Problem Relation Age of Onset   Cancer Mother    Heart disease Father     ADVANCED DIRECTIVES (Y/N):  N  HEALTH MAINTENANCE: Social History   Tobacco Use   Smoking status: Every Day    Packs/day: 0.50    Pack years: 0.00    Types: Cigarettes    Start date: 11/16/1978   Smokeless tobacco: Never  Vaping Use   Vaping Use: Never used  Substance Use Topics   Alcohol use: Yes    Alcohol/week: 3.0 standard drinks    Types: 3 Standard drinks or equivalent per week    Comment: occasional   Drug use: No     Colonoscopy:  PAP:  Bone density:  Lipid panel:  Allergies  Allergen Reactions   Avelox [Moxifloxacin] Other (See Comments)    dizziness   Lexapro [Escitalopram Oxalate] Other (See Comments)    Unsure of reaction   Rifampin Other (See Comments)    Unsure of reaction   Wellbutrin [Bupropion]     Anxiety/depression     Current Outpatient Medications  Medication Sig Dispense Refill   Acetylcysteine (NAC) 600 MG CAPS Take 600 mg by mouth in the morning.     Alpha Lipoic Acid 200 MG CAPS Take 200 mg by mouth daily.     ALPRAZolam (XANAX) 0.5 MG tablet Take 0.5 mg by mouth daily as needed for anxiety.     Arginine (L-ARGININE-500) 500 MG CAPS Take 500 mg by mouth daily as needed (energy).     aspirin EC 81 MG tablet Take 81 mg by mouth in the morning. Swallow  whole.     B Complex-C (B-COMPLEX WITH VITAMIN C) tablet Take 1 tablet by mouth daily.     Biotin 1000 MCG tablet Take 1,000 mcg by mouth daily.     Cholecalciferol (VITAMIN D3) 125 MCG (5000 UT) TABS Take 5,000 Units by mouth daily.     CINNAMON PO Take 6,000 mg by mouth in the morning. [place] Cinnamon     Coenzyme Q10 (COQ10) 100 MG CAPS Take 100 mg by mouth daily.     Cyanocobalamin (VITAMIN B-12) 3000 MCG SUBL Place 3,000 mcg under the tongue daily.     cyclobenzaprine (FLEXERIL) 10 MG tablet Take 10 mg by mouth 3 (three) times daily as needed for muscle spasms.     dexamethasone (DECADRON) 4 MG tablet Take 1 tablet (4 mg total) by mouth 2 (two) times daily with a meal. 60 tablet 1   Dextran 70-Hypromellose, PF, (TEARS NATURALE FREE) 0.1-0.3 % SOLN Place 1-2 drops into both eyes 3 (three) times daily as needed (dry/irritated eyes.).     diphenhydrAMINE HCl (ZZZQUIL) 50 MG/30ML LIQD Take 15-30  mLs by mouth at bedtime as needed (sleep).     fexofenadine (ALLEGRA) 180 MG tablet Take 180 mg by mouth in the morning.     Ginkgo Biloba 120 MG TABS Take 120 mg by mouth in the morning.     guaifenesin (HUMIBID E) 400 MG TABS tablet Take 400 mg by mouth 2 (two) times daily as needed (congestion/allergies).     losartan (COZAAR) 100 MG tablet Take 100 mg by mouth in the morning.     Lutein-Zeaxanthin 20-1 MG CAPS Take 1 capsule by mouth in the morning.     melatonin 5 MG TABS Take 5 mg by mouth at bedtime as needed (sleep).     metoprolol (LOPRESSOR) 50 MG tablet Take 50 mg by mouth 2 (two) times daily.     milk thistle 175 MG tablet Take 175 mg by mouth in the morning.     Multiple Vitamin (MULTIVITAMIN WITH MINERALS) TABS tablet Take 1 tablet by mouth daily. Men's 50+ Multivitamin     Naphazoline-Pheniramine (OPCON-A) 0.027-0.315 % SOLN Place 1 drop into both eyes 3 (three) times daily as needed (dry/irritated eyes.).     naproxen sodium (ALEVE) 220 MG tablet Take 220 mg by mouth daily as needed  (pain).     Omega-3 Fatty Acids (FISH OIL) 1200 MG CAPS Take 1,200 mg by mouth daily.     omeprazole (PRILOSEC) 20 MG capsule Take 20 mg by mouth in the morning.     oxyCODONE (OXY IR/ROXICODONE) 5 MG immediate release tablet Take 10 mg by mouth 2 (two) times daily as needed for severe pain (spinal stenosis pain.).     Saw Palmetto 450 MG CAPS Take 450 mg by mouth daily.     Selenium (SELENIMIN-200 PO) Take 200 mg by mouth daily.     simethicone (MYLICON) 675 MG chewable tablet Chew 125 mg by mouth in the morning.     simvastatin (ZOCOR) 20 MG tablet Take 20 mg by mouth daily before supper.     Testosterone 40.5 MG/2.5GM (1.62%) GEL Place 4 Pump onto the skin in the morning. Applied to shoulders     traZODone (DESYREL) 50 MG tablet Take 50 mg by mouth at bedtime as needed for sleep.     triamcinolone (NASACORT) 55 MCG/ACT AERO nasal inhaler Place 2 sprays into the nose daily.     TURMERIC CURCUMIN PO Take 1,500 mg by mouth daily.     umeclidinium-vilanterol (ANORO ELLIPTA) 62.5-25 MCG/INH AEPB Inhale 1 puff into the lungs daily as needed (respiratory issues.).     vitamin C (ASCORBIC ACID) 500 MG tablet Take 500 mg by mouth in the morning.     vitamin E 180 MG (400 UNITS) capsule Take 400 Units by mouth daily.     VOLTAREN 1 % GEL Apply 2 g topically 4 (four) times daily as needed (pain).  3   Zinc 50 MG TABS Take 50 mg by mouth daily.     zolpidem (AMBIEN) 10 MG tablet Take 10 mg by mouth at bedtime as needed for sleep.     dicyclomine (BENTYL) 20 MG tablet Take 20-40 mg by mouth daily as needed for spasms. (Patient not taking: Reported on 04/29/2021)     diphenoxylate-atropine (LOMOTIL) 2.5-0.025 MG per tablet Take 1 tablet by mouth 4 (four) times daily as needed for diarrhea or loose stools. (Patient not taking: Reported on 04/29/2021)     hydrocortisone (ANUSOL-HC) 2.5 % rectal cream Apply topically 2 (two) times daily as needed for hemorrhoids  or anal itching. (Patient not taking: Reported on  04/29/2021)     mupirocin ointment (BACTROBAN) 2 % APPLY TO AFFECTED AREA EVERY DAY (Patient not taking: Reported on 04/29/2021) 22 g 4   pregabalin (LYRICA) 50 MG capsule Take 50 mg by mouth daily as needed (feet neuropathy). (Patient not taking: Reported on 04/29/2021)     No current facility-administered medications for this visit.    OBJECTIVE: Vitals:   04/29/21 1446  BP: (!) 144/71  Pulse: 75  Resp: 16  Temp: (!) 97.5 F (36.4 C)  SpO2: 98%     Body mass index is 25.68 kg/m.    ECOG FS:0 - Asymptomatic  General: Well-developed, well-nourished, no acute distress. Eyes: Pink conjunctiva, anicteric sclera. HEENT: Normocephalic, moist mucous membranes. Lungs: No audible wheezing or coughing. Heart: Regular rate and rhythm. Abdomen: Soft, nontender, no obvious distention. Musculoskeletal: Mild, bilateral peripheral edema. Neuro: Alert, answering all questions appropriately. Cranial nerves grossly intact. Skin: No rashes or petechiae noted. Psych: Normal affect.    LAB RESULTS:  Lab Results  Component Value Date   NA 135 04/17/2021   K 4.4 04/17/2021   CL 100 04/17/2021   CO2 25 04/17/2021   GLUCOSE 103 (H) 04/17/2021   BUN 21 04/17/2021   CREATININE 0.76 04/17/2021   CALCIUM 8.5 (L) 04/17/2021   PROT 7.5 08/02/2014   ALBUMIN 4.0 08/02/2014   AST 29 08/02/2014   ALT 34 08/02/2014   ALKPHOS 57 08/02/2014   BILITOT 0.6 08/02/2014   GFRNONAA >60 04/17/2021   GFRAA >89 08/02/2014    Lab Results  Component Value Date   WBC 14.9 (H) 04/28/2021   NEUTROABS 5.3 08/02/2014   HGB 13.6 04/28/2021   HCT 40.9 04/28/2021   MCV 94.5 04/28/2021   PLT 113 (L) 04/28/2021     STUDIES: DG Chest Port 1 View  Result Date: 04/18/2021 CLINICAL DATA:  Status post bronchoscopy EXAM: PORTABLE CHEST 1 VIEW COMPARISON:  Chest CT April 16, 2021 FINDINGS: No evident pneumothorax. There is ill-defined opacity in the medial aspect of the right upper lobe at the site known pulmonary  nodule, much better delineated on recent CT. There is mild ill-defined opacity in the periphery of the right mid lung which could represent mild hemorrhage or effusion. Ill-defined nodular opacity in the right lower lung region seen on recent CT is not convincingly appreciable by radiography. On the left, there is mild lower lobe atelectatic change. No frank airspace consolidation noted. Port-A-Cath tip is at the cavoatrial junction. The heart size and pulmonary vascularity are normal. There is aortic atherosclerosis. Adenopathy noted in the right hilum on recent CT is not delineated by portable radiography. Status median sternotomy. No bone lesions. Surgical clips in right axilla. IMPRESSION: No pneumothorax. Ill-defined opacity medial right upper lobe with known pulmonary nodular lesion in this area better delineated on recent CT. Question mild hemorrhage versus effusion in right mid lung. Ill-defined opacity right lower lobe seen on recent CT not appreciable by radiography. No edema or consolidation.  Mild left lower lobe atelectasis. Heart size normal. Adenopathy in the right hilar region seen on CT is not apparent portable chest radiography. Port-A-Cath tip at cavoatrial junction. Postoperative changes noted. Electronically Signed   By: Lowella Grip III M.D.   On: 04/18/2021 14:57   DG C-Arm 1-60 Min-No Report  Result Date: 04/18/2021 Fluoroscopy was utilized by the requesting physician.  No radiographic interpretation.   IR IMAGING GUIDED PORT INSERTION  Result Date: 04/11/2021 INDICATION: 65 year old male with  non-small cell lung cancer metastatic to the brain. He presents for port catheter placement to establish durable venous access. EXAM: IMPLANTED PORT A CATH PLACEMENT WITH ULTRASOUND AND FLUOROSCOPIC GUIDANCE MEDICATIONS: None. ANESTHESIA/SEDATION: Versed 2 mg IV; Fentanyl 100 mcg IV; Moderate Sedation Time:  20 minutes The patient was continuously monitored during the procedure by the  interventional radiology nurse under my direct supervision. FLUOROSCOPY TIME:  0 minutes, 54 seconds (6.3 mGy) COMPLICATIONS: None immediate. PROCEDURE: The right neck and chest was prepped with chlorhexidine, and draped in the usual sterile fashion using maximum barrier technique (cap and mask, sterile gown, sterile gloves, large sterile sheet, hand hygiene and cutaneous antiseptic). Local anesthesia was attained by infiltration with 1% lidocaine with epinephrine. Ultrasound demonstrated patency of the right internal jugular vein, and this was documented with an image. Under real-time ultrasound guidance, this vein was accessed with a 21 gauge micropuncture needle and image documentation was performed. A small dermatotomy was made at the access site with an 11 scalpel. A 0.018" wire was advanced into the SVC and the access needle exchanged for a 15F micropuncture vascular sheath. The 0.018" wire was then removed and a 0.035" wire advanced into the IVC. An appropriate location for the subcutaneous reservoir was selected below the clavicle and an incision was made through the skin and underlying soft tissues. The subcutaneous tissues were then dissected using a combination of blunt and sharp surgical technique and a pocket was formed. A single lumen power injectable portacatheter was then tunneled through the subcutaneous tissues from the pocket to the dermatotomy and the port reservoir placed within the subcutaneous pocket. The venous access site was then serially dilated and a peel away vascular sheath placed over the wire. The wire was removed and the port catheter advanced into position under fluoroscopic guidance. The catheter tip is positioned in the superior cavoatrial junction. This was documented with a spot image. The portacatheter was then tested and found to flush and aspirate well. The port was flushed with saline followed by 100 units/mL heparinized saline. The pocket was then closed in two layers using  first subdermal inverted interrupted absorbable sutures followed by a running subcuticular suture. The epidermis was then sealed with Dermabond. The dermatotomy at the venous access site was also closed with Dermabond. IMPRESSION: Successful placement of a right IJ approach Power Port with ultrasound and fluoroscopic guidance. The catheter is ready for use. Electronically Signed   By: Jacqulynn Cadet M.D.   On: 04/11/2021 11:21   CT Super D Chest Wo Contrast  Result Date: 04/16/2021 CLINICAL DATA:  Upcoming endobronchial navigational biopsy on 06/03. Follow-up of previous PET evaluation from Mar 31, 2021 w. EXAM: CT CHEST WITHOUT CONTRAST TECHNIQUE: Multidetector CT imaging of the chest was performed using thin slice collimation for electromagnetic bronchoscopy planning purposes, without intravenous contrast. COMPARISON:  Mar 31, 2021 FINDINGS: Cardiovascular: Post median sternotomy. Calcified atheromatous plaque of the thoracic aorta signs of coronary artery calcification RIGHT and LEFT coronary circulation. Heart size stable without substantial pericardial fluid. Central pulmonary vasculature is of normal caliber. RIGHT-sided Port-A-Cath terminates in the upper RIGHT atrium. Limited assessment of cardiovascular structures given lack of intravenous contrast. Mediastinum/Nodes: Esophagus grossly normal. Bulky RIGHT hilar adenopathy shown to be hypermetabolic measures approximately 3.9 x 3.5 cm. No enlarged lymph nodes elsewhere in the chest. No axillary lymphadenopathy. No thoracic inlet lymphadenopathy. Lungs/Pleura: Irregular nodule in the RIGHT upper lobe (image 36/3) 1.7 x 1.2 cm similar to recent PET evaluation,z better displayed on current CT evaluation.  Small nodule in the RIGHT middle lobe (image 122/3) 5 mm. Parenchymal distortion in the RIGHT lower lobe with septal thickening and associated ground-glass measuring up to 3.9 x 2.0 cm when measured in a similar fashion this measured approximately 3.7 x  1.8 cm on the study of June of 2020 LEFT chest is clear. Airways are patent aside from narrowing of RIGHT upper lobe bronchus by the RIGHT hilar mass. Upper Abdomen: Incidental imaging of upper abdominal contents without acute process. The adrenal glands are normal. Musculoskeletal: Visualized clavicles and scapulae are unremarkable. Post median sternotomy for CABG. No destructive bone finding or acute bone process. Spinal degenerative changes. IMPRESSION: 1. Irregular RIGHT upper lobe nodule and RIGHT hilar adenopathy compatible with bronchogenic neoplasm as seen on recent PET-CT. 2. New nodule since June of 2020 in the RIGHT middle lobe and stable to slightly increased size of irregular area of nodularity and septal thickening in the RIGHT lower lobe could represent multifocal bronchogenic neoplasm, not shown to be hypermetabolic on the previous PET exam above mediastinal blood pool. Attention on follow-up. 3. Aortic atherosclerosis. Aortic Atherosclerosis (ICD10-I70.0). Electronically Signed   By: Zetta Bills M.D.   On: 04/16/2021 11:47     ASSESSMENT: Stage IVA adenocarcinoma of the lung with brain metastasis.  PLAN:    1. Stage IVA adenocarcinoma of the lung with brain metastasis: MRI of the brain from Mar 27, 2021 reviewed independently and reported as above with a total of 16 lesions consistent with metastatic disease.  Subsequent PET scan on Mar 31, 2021 revealed a hypermetabolic pulmonary nodule in the right apex along with a large right suprahilar nodal mass.  Biopsy confirmed adenocarcinoma of lung origin.  Molecular studies have been ordered and are pending at time of dictation.  Continue daily XRT.  Patient has had port placement.  Plan to give carboplatinum, Taxol, and Avastin with Neulasta support every 3 weeks for at least 4-6 cycles.  May consider changing treatment pending results of molecular studies.  Return to clinic in 1 week for further evaluation and consideration of cycle 1.   2.   Brain metastasis: Continue XRT as scheduled. 3.  Peripheral edema/insomnia: Patient has been instructed to dose reduce his Decadron to 4 mg once per day.  I spent a total of 30 minutes reviewing chart data, face-to-face evaluation with the patient, counseling and coordination of care as detailed above.    Patient expressed understanding and was in agreement with this plan. He also understands that He can call clinic at any time with any questions, concerns, or complaints.   Cancer Staging Adenocarcinoma of right lung [facility]) Staging form: Lung, AJCC 8th Edition - Clinical stage from 04/30/2021: Stage IVA (cT2a, cN2, cM1b) - Signed by Lloyd Huger, MD on 04/30/2021  Lloyd Huger, MD   04/30/2021 9:37 PM

## 2021-04-28 ENCOUNTER — Inpatient Hospital Stay: Payer: Medicare Other | Attending: Radiation Oncology

## 2021-04-28 ENCOUNTER — Other Ambulatory Visit: Payer: Self-pay | Admitting: Dermatology

## 2021-04-28 ENCOUNTER — Ambulatory Visit
Admission: RE | Admit: 2021-04-28 | Discharge: 2021-04-28 | Disposition: A | Discharge status: Home / Self Care | Payer: Medicare Other | Source: Ambulatory Visit | Attending: Radiation Oncology | Admitting: Radiation Oncology

## 2021-04-28 DIAGNOSIS — R918 Other nonspecific abnormal finding of lung field: Secondary | ICD-10-CM | POA: Diagnosis not present

## 2021-04-28 DIAGNOSIS — D6959 Other secondary thrombocytopenia: Secondary | ICD-10-CM | POA: Insufficient documentation

## 2021-04-28 DIAGNOSIS — Z86007 Personal history of in-situ neoplasm of skin: Secondary | ICD-10-CM

## 2021-04-28 DIAGNOSIS — C7931 Secondary malignant neoplasm of brain: Secondary | ICD-10-CM

## 2021-04-28 DIAGNOSIS — G4700 Insomnia, unspecified: Secondary | ICD-10-CM | POA: Insufficient documentation

## 2021-04-28 DIAGNOSIS — D72829 Elevated white blood cell count, unspecified: Secondary | ICD-10-CM | POA: Insufficient documentation

## 2021-04-28 DIAGNOSIS — Z5111 Encounter for antineoplastic chemotherapy: Secondary | ICD-10-CM | POA: Diagnosis present

## 2021-04-28 DIAGNOSIS — Z5112 Encounter for antineoplastic immunotherapy: Secondary | ICD-10-CM | POA: Insufficient documentation

## 2021-04-28 DIAGNOSIS — Z5189 Encounter for other specified aftercare: Secondary | ICD-10-CM | POA: Insufficient documentation

## 2021-04-28 DIAGNOSIS — C3491 Malignant neoplasm of unspecified part of right bronchus or lung: Secondary | ICD-10-CM | POA: Insufficient documentation

## 2021-04-28 LAB — CBC
HCT: 40.9 % (ref 39.0–52.0)
Hemoglobin: 13.6 g/dL (ref 13.0–17.0)
MCH: 31.4 pg (ref 26.0–34.0)
MCHC: 33.3 g/dL (ref 30.0–36.0)
MCV: 94.5 fL (ref 80.0–100.0)
Platelets: 113 10*3/uL — ABNORMAL LOW (ref 150–400)
RBC: 4.33 MIL/uL (ref 4.22–5.81)
RDW: 16.9 % — ABNORMAL HIGH (ref 11.5–15.5)
WBC: 14.9 10*3/uL — ABNORMAL HIGH (ref 4.0–10.5)
nRBC: 0 % (ref 0.0–0.2)

## 2021-04-29 ENCOUNTER — Other Ambulatory Visit: Payer: Self-pay

## 2021-04-29 ENCOUNTER — Encounter: Payer: Self-pay | Admitting: Oncology

## 2021-04-29 ENCOUNTER — Encounter: Payer: Self-pay | Admitting: *Deleted

## 2021-04-29 ENCOUNTER — Ambulatory Visit: Payer: Medicare Other

## 2021-04-29 ENCOUNTER — Ambulatory Visit
Admission: RE | Admit: 2021-04-29 | Discharge: 2021-04-29 | Disposition: A | Discharge status: Home / Self Care | Payer: Medicare Other | Source: Ambulatory Visit | Attending: Radiation Oncology | Admitting: Radiation Oncology

## 2021-04-29 ENCOUNTER — Inpatient Hospital Stay: Payer: Medicare Other | Admitting: Oncology

## 2021-04-29 VITALS — BP 144/71 | HR 75 | Temp 97.5°F | Resp 16 | Wt 211.0 lb

## 2021-04-29 DIAGNOSIS — C3491 Malignant neoplasm of unspecified part of right bronchus or lung: Secondary | ICD-10-CM | POA: Diagnosis not present

## 2021-04-29 DIAGNOSIS — R918 Other nonspecific abnormal finding of lung field: Secondary | ICD-10-CM | POA: Diagnosis not present

## 2021-04-29 DIAGNOSIS — C7931 Secondary malignant neoplasm of brain: Secondary | ICD-10-CM | POA: Diagnosis not present

## 2021-04-29 DIAGNOSIS — Z5112 Encounter for antineoplastic immunotherapy: Secondary | ICD-10-CM | POA: Diagnosis not present

## 2021-04-29 NOTE — Progress Notes (Signed)
Pt in for follow up, reports has occasional headache.  Reports since starting steroid medication, appetite is very good, gained weight and has difficulty sleeping without sleep aid med.

## 2021-04-29 NOTE — Progress Notes (Signed)
START ON PATHWAY REGIMEN - Non-Small Cell Lung     A cycle is every 21 days:     Paclitaxel      Carboplatin      Bevacizumab-xxxx   **Always confirm dose/schedule in your pharmacy ordering system**  Patient Characteristics: Stage IV Metastatic, Nonsquamous, Awaiting Molecular Test Results and Need to Start Chemotherapy, PS = 0, 1 Therapeutic Status: Stage IV Metastatic Histology: Nonsquamous Cell Broad Molecular Profiling Status: Awaiting Molecular Test Results and Need to Start Chemotherapy ECOG Performance Status: 1 Intent of Therapy: Non-Curative / Palliative Intent, Discussed with Patient

## 2021-04-30 ENCOUNTER — Ambulatory Visit
Admission: RE | Admit: 2021-04-30 | Discharge: 2021-04-30 | Disposition: A | Discharge status: Home / Self Care | Payer: Medicare Other | Source: Ambulatory Visit | Attending: Radiation Oncology | Admitting: Radiation Oncology

## 2021-04-30 ENCOUNTER — Encounter: Payer: Self-pay | Admitting: Oncology

## 2021-04-30 DIAGNOSIS — C3402 Malignant neoplasm of left main bronchus: Secondary | ICD-10-CM | POA: Diagnosis not present

## 2021-04-30 DIAGNOSIS — C7931 Secondary malignant neoplasm of brain: Secondary | ICD-10-CM | POA: Diagnosis not present

## 2021-04-30 DIAGNOSIS — R918 Other nonspecific abnormal finding of lung field: Secondary | ICD-10-CM | POA: Diagnosis not present

## 2021-04-30 MED ORDER — LIDOCAINE-PRILOCAINE 2.5-2.5 % EX CREA: TOPICAL_CREAM | CUTANEOUS | 3 refills | Status: AC

## 2021-04-30 MED ORDER — ONDANSETRON HCL 8 MG PO TABS: 8.0000 mg | ORAL_TABLET | Freq: Two times a day (BID) | ORAL | 1 refills | Status: AC | PRN

## 2021-04-30 MED ORDER — PROCHLORPERAZINE MALEATE 10 MG PO TABS: 10.0000 mg | ORAL_TABLET | Freq: Four times a day (QID) | ORAL | 1 refills | Status: AC | PRN

## 2021-04-30 NOTE — Progress Notes (Signed)
Met with patient during follow up visit to discuss treatment options. All questions answered during visit. Pt given resources regarding diagnosis and supportive services available. Reviewed upcoming appts. Instructed to call with any further questions or needs. Pt verbalized understanding.

## 2021-04-30 NOTE — Progress Notes (Signed)
Pharmacist Chemotherapy Monitoring - Initial Assessment    Anticipated start date: 05/07/21   Regimen:  Are orders appropriate based on the patient's diagnosis, regimen, and cycle? Yes Does the plan date match the patient's scheduled date? Yes Is the sequencing of drugs appropriate? Yes Are the premedications appropriate for the patient's regimen? Yes Prior Authorization for treatment is: Pending If applicable, is the correct biosimilar selected based on the patient's insurance? yes  Organ Function and Labs: Are dose adjustments needed based on the patient's renal function, hepatic function, or hematologic function? No Are appropriate labs ordered prior to the start of patient's treatment? Yes Other organ system assessment, if indicated: bevacizumab: baseline BP The following baseline labs, if indicated, have been ordered: bevacizumab: urine protein  Dose Assessment: Are the drug doses appropriate? Yes Are the following correct: Drug concentrations Yes IV fluid compatible with drug Yes Administration routes Yes Timing of therapy Yes If applicable, does the patient have documented access for treatment and/or plans for port-a-cath placement? yes If applicable, have lifetime cumulative doses been properly documented and assessed? yes Lifetime Dose Tracking  No doses have been documented on this patient for the following tracked chemicals: Doxorubicin, Epirubicin, Idarubicin, Daunorubicin, Mitoxantrone, Bleomycin, Oxaliplatin, Carboplatin, Liposomal Doxorubicin    Toxicity Monitoring/Prevention: The patient has the following take home antiemetics prescribed: Ondansetron, Prochlorperazine, Dexamethasone, and Lorazepam The patient has the following take home medications prescribed: N/A Medication allergies and previous infusion related reactions, if applicable, have been reviewed and addressed. Yes The patient's current medication list has been assessed for drug-drug interactions with  their chemotherapy regimen. no significant drug-drug interactions were identified on review.  Order Review: Are the treatment plan orders signed? No Is the patient scheduled to see a provider prior to their treatment? Yes  I verify that I have reviewed each item in the above checklist and answered each question accordingly.  Adelina Mings, RPH, 04/30/2021  10:47 AM

## 2021-05-01 ENCOUNTER — Ambulatory Visit
Admission: RE | Admit: 2021-05-01 | Discharge: 2021-05-01 | Disposition: A | Discharge status: Home / Self Care | Payer: Medicare Other | Source: Ambulatory Visit | Attending: Radiation Oncology | Admitting: Radiation Oncology

## 2021-05-01 DIAGNOSIS — C7931 Secondary malignant neoplasm of brain: Secondary | ICD-10-CM | POA: Diagnosis not present

## 2021-05-01 DIAGNOSIS — R918 Other nonspecific abnormal finding of lung field: Secondary | ICD-10-CM | POA: Diagnosis not present

## 2021-05-02 ENCOUNTER — Other Ambulatory Visit: Payer: Self-pay

## 2021-05-02 ENCOUNTER — Inpatient Hospital Stay: Payer: Medicare Other

## 2021-05-02 ENCOUNTER — Ambulatory Visit
Admission: RE | Admit: 2021-05-02 | Discharge: 2021-05-02 | Disposition: A | Discharge status: Home / Self Care | Payer: Medicare Other | Source: Ambulatory Visit | Attending: Radiation Oncology | Admitting: Radiation Oncology

## 2021-05-02 DIAGNOSIS — R918 Other nonspecific abnormal finding of lung field: Secondary | ICD-10-CM | POA: Diagnosis not present

## 2021-05-02 DIAGNOSIS — C7931 Secondary malignant neoplasm of brain: Secondary | ICD-10-CM | POA: Diagnosis not present

## 2021-05-05 ENCOUNTER — Telehealth: Payer: Self-pay | Admitting: *Deleted

## 2021-05-05 ENCOUNTER — Ambulatory Visit
Admission: RE | Admit: 2021-05-05 | Discharge: 2021-05-05 | Disposition: A | Discharge status: Home / Self Care | Payer: Medicare Other | Source: Ambulatory Visit | Attending: Radiation Oncology | Admitting: Radiation Oncology

## 2021-05-05 DIAGNOSIS — C3402 Malignant neoplasm of left main bronchus: Secondary | ICD-10-CM | POA: Diagnosis not present

## 2021-05-05 DIAGNOSIS — R918 Other nonspecific abnormal finding of lung field: Secondary | ICD-10-CM | POA: Diagnosis not present

## 2021-05-05 DIAGNOSIS — C7931 Secondary malignant neoplasm of brain: Secondary | ICD-10-CM | POA: Diagnosis not present

## 2021-05-05 MED ORDER — VARENICLINE TARTRATE 0.5 MG X 11 & 1 MG X 42 PO MISC: ORAL | 0 refills | Status: AC

## 2021-05-05 NOTE — Telephone Encounter (Signed)
Prescription sent into pharmacy. Pt has been made aware.

## 2021-05-05 NOTE — Telephone Encounter (Signed)
Pt called in to request for prescription for chantix. States has taken before and tolerated well. Pt is ready to quit smoking at this time.   Please advise if okay to send in prescription.

## 2021-05-06 ENCOUNTER — Ambulatory Visit
Admission: RE | Admit: 2021-05-06 | Discharge: 2021-05-06 | Disposition: A | Discharge status: Home / Self Care | Payer: Medicare Other | Source: Ambulatory Visit | Attending: Radiation Oncology | Admitting: Radiation Oncology

## 2021-05-06 DIAGNOSIS — C7931 Secondary malignant neoplasm of brain: Secondary | ICD-10-CM | POA: Diagnosis not present

## 2021-05-06 DIAGNOSIS — R918 Other nonspecific abnormal finding of lung field: Secondary | ICD-10-CM | POA: Diagnosis not present

## 2021-05-07 ENCOUNTER — Ambulatory Visit: Payer: Medicare Other

## 2021-05-07 ENCOUNTER — Other Ambulatory Visit: Payer: Self-pay

## 2021-05-07 ENCOUNTER — Inpatient Hospital Stay (HOSPITAL_BASED_OUTPATIENT_CLINIC_OR_DEPARTMENT_OTHER): Payer: Medicare Other | Admitting: Oncology

## 2021-05-07 ENCOUNTER — Inpatient Hospital Stay: Payer: Medicare Other

## 2021-05-07 ENCOUNTER — Encounter: Payer: Self-pay | Admitting: *Deleted

## 2021-05-07 VITALS — BP 143/75 | HR 77 | Temp 96.2°F | Resp 19

## 2021-05-07 VITALS — BP 126/65 | HR 78 | Temp 98.0°F | Resp 18 | Wt 214.0 lb

## 2021-05-07 DIAGNOSIS — C3491 Malignant neoplasm of unspecified part of right bronchus or lung: Secondary | ICD-10-CM | POA: Diagnosis not present

## 2021-05-07 DIAGNOSIS — Z5112 Encounter for antineoplastic immunotherapy: Secondary | ICD-10-CM | POA: Diagnosis not present

## 2021-05-07 LAB — URINALYSIS, DIPSTICK ONLY
Glucose, UA: NEGATIVE mg/dL
Hgb urine dipstick: NEGATIVE
Ketones, ur: 5 mg/dL — AB
Leukocytes,Ua: NEGATIVE
Nitrite: NEGATIVE
Protein, ur: 30 mg/dL — AB
Specific Gravity, Urine: 1.042 — ABNORMAL HIGH (ref 1.005–1.030)
pH: 5 (ref 5.0–8.0)

## 2021-05-07 LAB — COMPREHENSIVE METABOLIC PANEL
ALT: 43 U/L (ref 0–44)
AST: 26 U/L (ref 15–41)
Albumin: 3.6 g/dL (ref 3.5–5.0)
Alkaline Phosphatase: 62 U/L (ref 38–126)
Anion gap: 8 (ref 5–15)
BUN: 22 mg/dL (ref 8–23)
CO2: 28 mmol/L (ref 22–32)
Calcium: 8.1 mg/dL — ABNORMAL LOW (ref 8.9–10.3)
Chloride: 106 mmol/L (ref 98–111)
Creatinine, Ser: 0.68 mg/dL (ref 0.61–1.24)
GFR, Estimated: >60 mL/min (ref >60)
Glucose, Bld: 93 mg/dL (ref 70–99)
Potassium: 3.8 mmol/L (ref 3.5–5.1)
Sodium: 142 mmol/L (ref 135–145)
Total Bilirubin: 0.9 mg/dL (ref 0.3–1.2)
Total Protein: 6.4 g/dL — ABNORMAL LOW (ref 6.5–8.1)

## 2021-05-07 LAB — CBC WITH DIFFERENTIAL/PLATELET
Abs Immature Granulocytes: 0.11 10*3/uL — ABNORMAL HIGH (ref 0.00–0.07)
Basophils Absolute: 0 10*3/uL (ref 0.0–0.1)
Basophils Relative: 0 %
Eosinophils Absolute: 0.1 10*3/uL (ref 0.0–0.5)
Eosinophils Relative: 1 %
HCT: 41.2 % (ref 39.0–52.0)
Hemoglobin: 13.7 g/dL (ref 13.0–17.0)
Immature Granulocytes: 1 %
Lymphocytes Relative: 30 %
Lymphs Abs: 3.3 10*3/uL (ref 0.7–4.0)
MCH: 31.6 pg (ref 26.0–34.0)
MCHC: 33.3 g/dL (ref 30.0–36.0)
MCV: 94.9 fL (ref 80.0–100.0)
Monocytes Absolute: 1 10*3/uL (ref 0.1–1.0)
Monocytes Relative: 9 %
Neutro Abs: 6.7 10*3/uL (ref 1.7–7.7)
Neutrophils Relative %: 59 %
Platelets: 162 10*3/uL (ref 150–400)
RBC: 4.34 MIL/uL (ref 4.22–5.81)
RDW: 17.9 % — ABNORMAL HIGH (ref 11.5–15.5)
WBC: 11.3 10*3/uL — ABNORMAL HIGH (ref 4.0–10.5)
nRBC: 0 % (ref 0.0–0.2)

## 2021-05-07 MED ORDER — HEPARIN SOD (PORK) LOCK FLUSH 100 UNIT/ML IV SOLN
500.0000 [IU] | Freq: Once | INTRAVENOUS | Status: AC
  Administered 2021-05-07: 500 [IU] via INTRAVENOUS
  Filled 2021-05-07: qty 5

## 2021-05-07 MED ORDER — HEPARIN SOD (PORK) LOCK FLUSH 100 UNIT/ML IV SOLN
500.0000 [IU] | Freq: Once | INTRAVENOUS | Status: DC | PRN
  Filled 2021-05-07: qty 5

## 2021-05-07 MED ORDER — PALONOSETRON HCL INJECTION 0.25 MG/5ML
0.2500 mg | Freq: Once | INTRAVENOUS | Status: AC
  Administered 2021-05-07: 0.25 mg via INTRAVENOUS
  Filled 2021-05-07: qty 5

## 2021-05-07 MED ORDER — SODIUM CHLORIDE 0.9 % IV SOLN
10.0000 mg | Freq: Once | INTRAVENOUS | Status: AC
  Administered 2021-05-07: 10 mg via INTRAVENOUS
  Filled 2021-05-07: qty 10

## 2021-05-07 MED ORDER — SODIUM CHLORIDE 0.9% FLUSH
10.0000 mL | INTRAVENOUS | Status: DC | PRN
Start: 2021-05-07 — End: 2021-05-07
  Administered 2021-05-07: 10 mL via INTRAVENOUS
  Filled 2021-05-07: qty 10

## 2021-05-07 MED ORDER — PACLITAXEL CHEMO INJECTION 300 MG/50ML
200.0000 mg/m2 | Freq: Once | INTRAVENOUS | Status: AC
  Administered 2021-05-07: 456 mg via INTRAVENOUS
  Filled 2021-05-07: qty 76

## 2021-05-07 MED ORDER — SODIUM CHLORIDE 0.9 % IV SOLN
150.0000 mg | Freq: Once | INTRAVENOUS | Status: AC
  Administered 2021-05-07: 150 mg via INTRAVENOUS
  Filled 2021-05-07: qty 150

## 2021-05-07 MED ORDER — SODIUM CHLORIDE 0.9 % IV SOLN
748.2000 mg | Freq: Once | INTRAVENOUS | Status: AC
  Administered 2021-05-07: 750 mg via INTRAVENOUS
  Filled 2021-05-07: qty 75

## 2021-05-07 MED ORDER — SODIUM CHLORIDE 0.9 % IV SOLN
Freq: Once | INTRAVENOUS | Status: AC
  Filled 2021-05-07: qty 250

## 2021-05-07 MED ORDER — FAMOTIDINE 20 MG IN NS 100 ML IVPB
20.0000 mg | Freq: Once | INTRAVENOUS | Status: AC
  Administered 2021-05-07: 20 mg via INTRAVENOUS
  Filled 2021-05-07: qty 100
  Filled 2021-05-07: qty 20

## 2021-05-07 MED ORDER — SODIUM CHLORIDE 0.9 % IV SOLN
15.0000 mg/kg | Freq: Once | INTRAVENOUS | Status: AC
  Administered 2021-05-07: 1400 mg via INTRAVENOUS
  Filled 2021-05-07: qty 48

## 2021-05-07 MED ORDER — DIPHENHYDRAMINE HCL 50 MG/ML IJ SOLN
25.0000 mg | Freq: Once | INTRAMUSCULAR | Status: AC
Start: 2021-05-07 — End: 2021-05-07
  Administered 2021-05-07: 25 mg via INTRAVENOUS
  Filled 2021-05-07: qty 1

## 2021-05-07 NOTE — Progress Notes (Signed)
Patient complains of side effects from dexamethasone, he has not taken this morning yet. Wants to know if its okay.

## 2021-05-07 NOTE — Progress Notes (Signed)
Pt tolerated chemotherapy infusion well with no signs of complications. RN educated pt on the importance of notifying the clinic if any complications occur at home, pt verbalized understanding and all questions answered at this time. VSS. Pt stable for discharge.   Matthew Kelly [employer]

## 2021-05-07 NOTE — Progress Notes (Signed)
Matthew Kelly  [phone] [phone]) [phone]  ID: Matthew Kelly OB: [birth year]  MR#: [id number]  SEG#:[id number]  Patient Care Team: Josetta Huddle, MD as PCP - General (Internal Medicine) Garlan Fair, MD as Consulting Physician (Gastroenterology) Telford Nab, RN as Oncology Nurse Navigator [name], MD as Consulting Physician (Pulmonary Disease)  CHIEF COMPLAINT: Stage IVA adenocarcinoma of the lung with brain metastasis.  INTERVAL HISTORY: Patient returns to clinic today for further evaluation and initiation of cycle 1 of carboplatinum, Taxol, and Avastin.  He continues to be anxious, but otherwise feels well.  His peripheral edema has improved since decreasing his dexamethasone dosage.  His visual changes have resolved. He has no neurologic complaints.   He denies any recent fevers or illnesses.  He has no chest pain, shortness of breath, cough, or hemoptysis.  He denies any nausea, vomiting, constipation, or diarrhea.  He has no urinary complaints.  Patient offers no further specific complaints today.  REVIEW OF SYSTEMS:   Review of Systems  Constitutional: Negative.  Negative for fever, malaise/fatigue and weight loss.  Eyes: Negative.  Negative for blurred vision.  Respiratory: Negative.  Negative for cough, hemoptysis and shortness of breath.   Cardiovascular:  Positive for leg swelling. Negative for chest pain.  Gastrointestinal: Negative.  Negative for abdominal pain.  Genitourinary: Negative.  Negative for dysuria.  Musculoskeletal: Negative.  Negative for back pain.  Skin: Negative.  Negative for rash.  Neurological: Negative.  Negative for focal weakness, weakness and headaches.  Psychiatric/Behavioral:  The patient is nervous/anxious and has insomnia.    As per HPI. Otherwise, a complete review of systems is negative.  PAST MEDICAL HISTORY: Past Medical History:  Diagnosis Date   Anxiety    Aortic atherosclerosis ([place])     Aortic stenosis    s/p AVR in 05/2013   Arthritis    Bicuspid aortic valve    Bronchogenic carcinoma of right lung ([place])    MRI confirmed brain metastasis   CHF (congestive heart failure) (HCC)    COPD (chronic obstructive pulmonary disease) (HCC)    DDD (degenerative disc disease), lumbar    Dysplastic nevus 12/30/2020   R post waistline - moderate   Erectile dysfunction    GERD (gastroesophageal reflux disease)    Heart murmur    Hepatitis C    dx'd 1980s.  treated with Harvoni approx 2015   HLD (hyperlipidemia)    Homonymous hemianopia, left    Hypertension    Hypogonadism in male    Insomnia    Pericarditis    Spinal stenosis, lumbar 07/25/2019   severe   Squamous cell carcinoma of skin 12/18/2019   SCCIS L lat ankle   Squamous cell carcinoma of skin 11/07/2008   SCCIS L wrist   Thoracic aortic aneurysm [facility])    s/p aortic arch/hemi-arch repair in 05/2013    PAST SURGICAL HISTORY: Past Surgical History:  Procedure Laterality Date   AORTIC VALVE REPLACEMENT N/A 06/01/2013   Procedure: REPLACEMENT AORTIC VALVE; Surgeon: Gretchen Portela, MD; Location: [facility]; Service: Cardiothoracic; Laterality: N/A; Bioprosthetic 25 mm [employer] trifecta bovine pericardial tissue valve   CATARACT EXTRACTION W/PHACO Right 12/17/2020   Procedure: CATARACT EXTRACTION PHACO AND INTRAOCULAR LENS PLACEMENT ([place]) RIGHT 6.67 00:49.5;  Surgeon: Birder Robson, MD;  Location: [facility];  Service: Ophthalmology;  Laterality: Right;   COLONOSCOPY WITH PROPOFOL N/A 01/04/2017   Procedure: COLONOSCOPY WITH PROPOFOL;  Surgeon: Garlan Fair, MD;  Location: [facility];  Service:  Endoscopy;  Laterality: N/A;   IR IMAGING GUIDED PORT INSERTION  04/11/2021   ORCHIECTOMY Left 2000   REPAIR THORACIC AORTA N/A 06/01/2013   Procedure: REPAIR THORACIC AORTIC ANEURYSM W/TRANSVERSE ARCH GRAFT - HemiArch; Surgeon: Gretchen Portela, MD; Location: [facility];  Service: Cardiothoracic; Laterality: N/A; #24 mm Dacron graft   VIDEO BRONCHOSCOPY WITH ENDOBRONCHIAL NAVIGATION N/A 04/18/2021   Procedure: VIDEO BRONCHOSCOPY WITH ENDOBRONCHIAL NAVIGATION;  Surgeon: [name], MD;  Location: [facility];  Service: Thoracic;  Laterality: N/A;   VIDEO BRONCHOSCOPY WITH ENDOBRONCHIAL ULTRASOUND N/A 04/18/2021   Procedure: VIDEO BRONCHOSCOPY WITH ENDOBRONCHIAL ULTRASOUND;  Surgeon: [name], MD;  Location: [facility];  Service: Thoracic;  Laterality: N/A;    FAMILY HISTORY: Family History  Problem Relation Age of Onset   Cancer Mother    Heart disease Father     ADVANCED DIRECTIVES (Y/N):  N  HEALTH MAINTENANCE: Social History   Tobacco Use   Smoking status: Every Day    Packs/day: 0.50    Pack years: 0.00    Types: Cigarettes    Start date: 11/16/1978   Smokeless tobacco: Never  Vaping Use   Vaping Use: Never used  Substance Use Topics   Alcohol use: Yes    Alcohol/week: 3.0 standard drinks    Types: 3 Standard drinks or equivalent per week    Comment: occasional   Drug use: No     Colonoscopy:  PAP:  Bone density:  Lipid panel:  Allergies  Allergen Reactions   Avelox [Moxifloxacin] Other (See Comments)    dizziness   Escitalopram Other (See Comments)   Lexapro [Escitalopram Oxalate] Other (See Comments)    Unsure of reaction   Rifampin Other (See Comments)    Unsure of reaction   Wellbutrin [Bupropion]     Anxiety/depression     Current Outpatient Medications  Medication Sig Dispense Refill   Acetylcysteine (NAC) 600 MG CAPS Take 600 mg by mouth in the morning.     Alpha Lipoic Acid 200 MG CAPS Take 200 mg by mouth daily.     ALPRAZolam (XANAX) 0.5 MG tablet Take 0.5 mg by mouth daily as needed for anxiety.     Arginine (L-ARGININE-500) 500 MG CAPS Take 500 mg by mouth daily as needed (energy).     aspirin EC 81 MG tablet Take 81 mg by mouth in the morning. Swallow whole.     B Complex-C (B-COMPLEX WITH VITAMIN C)  tablet Take 1 tablet by mouth daily.     Biotin 1000 MCG tablet Take 1,000 mcg by mouth daily.     Cholecalciferol (VITAMIN D3) 125 MCG (5000 UT) TABS Take 5,000 Units by mouth daily.     CINNAMON PO Take 6,000 mg by mouth in the morning. [place] Cinnamon     Coenzyme Q10 (COQ10) 100 MG CAPS Take 100 mg by mouth daily.     Cyanocobalamin (VITAMIN B-12) 3000 MCG SUBL Place 3,000 mcg under the tongue daily.     cyclobenzaprine (FLEXERIL) 10 MG tablet Take 10 mg by mouth 3 (three) times daily as needed for muscle spasms.     dexamethasone (DECADRON) 4 MG tablet Take 1 tablet (4 mg total) by mouth 2 (two) times daily with a meal. 60 tablet 1   Dextran 70-Hypromellose, PF, (TEARS NATURALE FREE) 0.1-0.3 % SOLN Place 1-2 drops into both eyes 3 (three) times daily as needed (dry/irritated eyes.).     diphenhydrAMINE HCl (ZZZQUIL) 50 MG/30ML LIQD Take 15-30 mLs by mouth at bedtime  as needed (sleep).     fexofenadine (ALLEGRA) 180 MG tablet Take 180 mg by mouth in the morning.     Ginkgo Biloba 120 MG TABS Take 120 mg by mouth in the morning.     guaifenesin (HUMIBID E) 400 MG TABS tablet Take 400 mg by mouth 2 (two) times daily as needed (congestion/allergies).     lidocaine-prilocaine (EMLA) cream Apply to affected area once 30 g 3   losartan (COZAAR) 100 MG tablet Take 100 mg by mouth in the morning.     Lutein-Zeaxanthin 20-1 MG CAPS Take 1 capsule by mouth in the morning.     melatonin 5 MG TABS Take 5 mg by mouth at bedtime as needed (sleep).     metoprolol (LOPRESSOR) 50 MG tablet Take 50 mg by mouth 2 (two) times daily.     milk thistle 175 MG tablet Take 175 mg by mouth in the morning.     Multiple Vitamin (MULTIVITAMIN WITH MINERALS) TABS tablet Take 1 tablet by mouth daily. Men's 50+ Multivitamin     Naphazoline-Pheniramine (OPCON-A) 0.027-0.315 % SOLN Place 1 drop into both eyes 3 (three) times daily as needed (dry/irritated eyes.).     naproxen sodium (ALEVE) 220 MG tablet Take 220 mg by mouth  daily as needed (pain).     Omega-3 Fatty Acids (FISH OIL) 1200 MG CAPS Take 1,200 mg by mouth daily.     omeprazole (PRILOSEC) 20 MG capsule Take 20 mg by mouth in the morning.     ondansetron (ZOFRAN) 8 MG tablet Take 1 tablet (8 mg total) by mouth 2 (two) times daily as needed for refractory nausea / vomiting. Start on day 3 after carboplatin chemo. 60 tablet 1   oxyCODONE (OXY IR/ROXICODONE) 5 MG immediate release tablet Take 10 mg by mouth 2 (two) times daily as needed for severe pain (spinal stenosis pain.).     Saw Palmetto 450 MG CAPS Take 450 mg by mouth daily.     Selenium (SELENIMIN-200 PO) Take 200 mg by mouth daily.     simethicone (MYLICON) 710 MG chewable tablet Chew 125 mg by mouth in the morning.     simvastatin (ZOCOR) 20 MG tablet Take 20 mg by mouth daily before supper.     Testosterone 20.25 MG/ACT (1.62%) GEL APPLY 4 PUMPS DAILY     Testosterone 40.5 MG/2.5GM (1.62%) GEL Place 4 Pump onto the skin in the morning. Applied to shoulders     traZODone (DESYREL) 50 MG tablet Take 50 mg by mouth at bedtime as needed for sleep.     triamcinolone (NASACORT) 55 MCG/ACT AERO nasal inhaler Place 2 sprays into the nose daily.     TURMERIC CURCUMIN PO Take 1,500 mg by mouth daily.     umeclidinium-vilanterol (ANORO ELLIPTA) 62.5-25 MCG/INH AEPB Inhale 1 puff into the lungs daily as needed (respiratory issues.).     varenicline (CHANTIX STARTING MONTH PAK) 0.5 MG X 11 & 1 MG X 42 tablet Take one 0.5 mg tablet by mouth once daily for 3 days, then increase to one 0.5 mg tablet twice daily for 4 days, then increase to one 1 mg tablet twice daily. 53 tablet 0   vitamin C (ASCORBIC ACID) 500 MG tablet Take 500 mg by mouth in the morning.     vitamin E 180 MG (400 UNITS) capsule Take 400 Units by mouth daily.     VOLTAREN 1 % GEL Apply 2 g topically 4 (four) times daily as needed (pain).  3   Zinc 50 MG TABS Take 50 mg by mouth daily.     zolpidem (AMBIEN) 10 MG tablet Take 10 mg by mouth at  bedtime as needed for sleep.     dicyclomine (BENTYL) 20 MG tablet Take 20-40 mg by mouth daily as needed for spasms. (Patient not taking: No sig reported)     diphenoxylate-atropine (LOMOTIL) 2.5-0.025 MG per tablet Take 1 tablet by mouth 4 (four) times daily as needed for diarrhea or loose stools. (Patient not taking: No sig reported)     hydrocortisone (ANUSOL-HC) 2.5 % rectal cream Apply topically 2 (two) times daily as needed for hemorrhoids or anal itching. (Patient not taking: No sig reported)     mupirocin ointment (BACTROBAN) 2 % APPLY TO AFFECTED AREA EVERY DAY (Patient not taking: No sig reported) 22 g 4   pregabalin (LYRICA) 50 MG capsule Take 50 mg by mouth daily as needed (feet neuropathy). (Patient not taking: No sig reported)     prochlorperazine (COMPAZINE) 10 MG tablet Take 1 tablet (10 mg total) by mouth every 6 (six) hours as needed (Nausea or vomiting). (Patient not taking: Reported on 05/07/2021) 60 tablet 1   No current facility-administered medications for this visit.    OBJECTIVE: Vitals:   05/07/21 0905  BP: 126/65  Pulse: 78  Resp: 18  Temp: 98 F (36.7 C)  SpO2: 97%     Body mass index is 26.05 kg/m.    ECOG FS:0 - Asymptomatic  General: Well-developed, well-nourished, no acute distress. Eyes: Pink conjunctiva, anicteric sclera. HEENT: Normocephalic, moist mucous membranes. Lungs: No audible wheezing or coughing. Heart: Regular rate and rhythm. Abdomen: Soft, nontender, no obvious distention. Musculoskeletal: No edema, cyanosis, or clubbing. Neuro: Alert, answering all questions appropriately. Cranial nerves grossly intact. Skin: No rashes or petechiae noted. Psych: Normal affect.   LAB RESULTS:  Lab Results  Component Value Date   NA 142 05/07/2021   K 3.8 05/07/2021   CL 106 05/07/2021   CO2 28 05/07/2021   GLUCOSE 93 05/07/2021   BUN 22 05/07/2021   CREATININE 0.68 05/07/2021   CALCIUM 8.1 (L) 05/07/2021   PROT 6.4 (L) 05/07/2021   ALBUMIN  3.6 05/07/2021   AST 26 05/07/2021   ALT 43 05/07/2021   ALKPHOS 62 05/07/2021   BILITOT 0.9 05/07/2021   GFRNONAA >60 05/07/2021   GFRAA >89 08/02/2014    Lab Results  Component Value Date   WBC 11.3 (H) 05/07/2021   NEUTROABS 6.7 05/07/2021   HGB 13.7 05/07/2021   HCT 41.2 05/07/2021   MCV 94.9 05/07/2021   PLT 162 05/07/2021     STUDIES: DG Chest Port 1 View  Result Date: 04/18/2021 CLINICAL DATA:  Status post bronchoscopy EXAM: PORTABLE CHEST 1 VIEW COMPARISON:  Chest CT April 16, 2021 FINDINGS: No evident pneumothorax. There is ill-defined opacity in the medial aspect of the right upper lobe at the site known pulmonary nodule, much better delineated on recent CT. There is mild ill-defined opacity in the periphery of the right mid lung which could represent mild hemorrhage or effusion. Ill-defined nodular opacity in the right lower lung region seen on recent CT is not convincingly appreciable by radiography. On the left, there is mild lower lobe atelectatic change. No frank airspace consolidation noted. Port-A-Cath tip is at the cavoatrial junction. The heart size and pulmonary vascularity are normal. There is aortic atherosclerosis. Adenopathy noted in the right hilum on recent CT is not delineated by portable radiography. Status median sternotomy.  No bone lesions. Surgical clips in right axilla. IMPRESSION: No pneumothorax. Ill-defined opacity medial right upper lobe with known pulmonary nodular lesion in this area better delineated on recent CT. Question mild hemorrhage versus effusion in right mid lung. Ill-defined opacity right lower lobe seen on recent CT not appreciable by radiography. No edema or consolidation.  Mild left lower lobe atelectasis. Heart size normal. Adenopathy in the right hilar region seen on CT is not apparent portable chest radiography. Port-A-Cath tip at cavoatrial junction. Postoperative changes noted. Electronically Signed   By: Lowella Grip III M.D.   On:  04/18/2021 14:57   DG C-Arm 1-60 Min-No Report  Result Date: 04/18/2021 Fluoroscopy was utilized by the requesting physician.  No radiographic interpretation.   IR IMAGING GUIDED PORT INSERTION  Result Date: 04/11/2021 INDICATION: 65 year old male with non-small cell lung cancer metastatic to the brain. He presents for port catheter placement to establish durable venous access. EXAM: IMPLANTED PORT A CATH PLACEMENT WITH ULTRASOUND AND FLUOROSCOPIC GUIDANCE MEDICATIONS: None. ANESTHESIA/SEDATION: Versed 2 mg IV; Fentanyl 100 mcg IV; Moderate Sedation Time:  20 minutes The patient was continuously monitored during the procedure by the interventional radiology nurse under my direct supervision. FLUOROSCOPY TIME:  0 minutes, 54 seconds (6.3 mGy) COMPLICATIONS: None immediate. PROCEDURE: The right neck and chest was prepped with chlorhexidine, and draped in the usual sterile fashion using maximum barrier technique (cap and mask, sterile gown, sterile gloves, large sterile sheet, hand hygiene and cutaneous antiseptic). Local anesthesia was attained by infiltration with 1% lidocaine with epinephrine. Ultrasound demonstrated patency of the right internal jugular vein, and this was documented with an image. Under real-time ultrasound guidance, this vein was accessed with a 21 gauge micropuncture needle and image documentation was performed. A small dermatotomy was made at the access site with an 11 scalpel. A 0.018" wire was advanced into the SVC and the access needle exchanged for a 67F micropuncture vascular sheath. The 0.018" wire was then removed and a 0.035" wire advanced into the IVC. An appropriate location for the subcutaneous reservoir was selected below the clavicle and an incision was made through the skin and underlying soft tissues. The subcutaneous tissues were then dissected using a combination of blunt and sharp surgical technique and a pocket was formed. A single lumen power injectable portacatheter  was then tunneled through the subcutaneous tissues from the pocket to the dermatotomy and the port reservoir placed within the subcutaneous pocket. The venous access site was then serially dilated and a peel away vascular sheath placed over the wire. The wire was removed and the port catheter advanced into position under fluoroscopic guidance. The catheter tip is positioned in the superior cavoatrial junction. This was documented with a spot image. The portacatheter was then tested and found to flush and aspirate well. The port was flushed with saline followed by 100 units/mL heparinized saline. The pocket was then closed in two layers using first subdermal inverted interrupted absorbable sutures followed by a running subcuticular suture. The epidermis was then sealed with Dermabond. The dermatotomy at the venous access site was also closed with Dermabond. IMPRESSION: Successful placement of a right IJ approach Power Port with ultrasound and fluoroscopic guidance. The catheter is ready for use. Electronically Signed   By: Jacqulynn Cadet M.D.   On: 04/11/2021 11:21   CT Super D Chest Wo Contrast  Result Date: 04/16/2021 CLINICAL DATA:  Upcoming endobronchial navigational biopsy on 06/03. Follow-up of previous PET evaluation from Mar 31, 2021 w. EXAM: CT CHEST  WITHOUT CONTRAST TECHNIQUE: Multidetector CT imaging of the chest was performed using thin slice collimation for electromagnetic bronchoscopy planning purposes, without intravenous contrast. COMPARISON:  Mar 31, 2021 FINDINGS: Cardiovascular: Post median sternotomy. Calcified atheromatous plaque of the thoracic aorta signs of coronary artery calcification RIGHT and LEFT coronary circulation. Heart size stable without substantial pericardial fluid. Central pulmonary vasculature is of normal caliber. RIGHT-sided Port-A-Cath terminates in the upper RIGHT atrium. Limited assessment of cardiovascular structures given lack of intravenous contrast.  Mediastinum/Nodes: Esophagus grossly normal. Bulky RIGHT hilar adenopathy shown to be hypermetabolic measures approximately 3.9 x 3.5 cm. No enlarged lymph nodes elsewhere in the chest. No axillary lymphadenopathy. No thoracic inlet lymphadenopathy. Lungs/Pleura: Irregular nodule in the RIGHT upper lobe (image 36/3) 1.7 x 1.2 cm similar to recent PET evaluation,z better displayed on current CT evaluation. Small nodule in the RIGHT middle lobe (image 122/3) 5 mm. Parenchymal distortion in the RIGHT lower lobe with septal thickening and associated ground-glass measuring up to 3.9 x 2.0 cm when measured in a similar fashion this measured approximately 3.7 x 1.8 cm on the study of June of 2020 LEFT chest is clear. Airways are patent aside from narrowing of RIGHT upper lobe bronchus by the RIGHT hilar mass. Upper Abdomen: Incidental imaging of upper abdominal contents without acute process. The adrenal glands are normal. Musculoskeletal: Visualized clavicles and scapulae are unremarkable. Post median sternotomy for CABG. No destructive bone finding or acute bone process. Spinal degenerative changes. IMPRESSION: 1. Irregular RIGHT upper lobe nodule and RIGHT hilar adenopathy compatible with bronchogenic neoplasm as seen on recent PET-CT. 2. New nodule since June of 2020 in the RIGHT middle lobe and stable to slightly increased size of irregular area of nodularity and septal thickening in the RIGHT lower lobe could represent multifocal bronchogenic neoplasm, not shown to be hypermetabolic on the previous PET exam above mediastinal blood pool. Attention on follow-up. 3. Aortic atherosclerosis. Aortic Atherosclerosis (ICD10-I70.0). Electronically Signed   By: Zetta Bills M.D.   On: 04/16/2021 11:47     ASSESSMENT: Stage IVA adenocarcinoma of the lung with brain metastasis.  PLAN:    1. Stage IVA adenocarcinoma of the lung with brain metastasis: MRI of the brain from Mar 27, 2021 reviewed independently with a total  of 16 lesions consistent with metastatic disease.  Subsequent PET scan on Mar 31, 2021 revealed a hypermetabolic pulmonary nodule in the right apex along with a large right suprahilar nodal mass.  Biopsy confirmed adenocarcinoma of lung origin.  Molecular studies have been ordered and are pending at time of dictation.  Continue daily XRT.  Patient has had port placement.  Plan to give carboplatinum, Taxol, and Avastin with Neulasta support every 3 weeks for at least 4-6 cycles.  May consider changing treatment pending results of molecular studies.  Proceed with cycle 1 of treatment today.  Return to clinic in 2 days for Fulphila, in 1 week for laboratory work and further evaluation, and then in 3 weeks for further evaluation and consideration of cycle 2. 2.  Brain metastasis: Continue XRT as scheduled.  Patient will complete treatment on May 14, 2021. 3.  Peripheral edema/insomnia: Improved.  Continue Decadron 4 mg once per day.  Will consider dose reduction next week after patient finishes his XRT. 4.  Leukocytosis: Likely secondary to steroids.    Patient expressed understanding and was in agreement with this plan. He also understands that He can call clinic at any time with any questions, concerns, or complaints.   Cancer Staging  Adenocarcinoma of right lung [facility]) Staging form: Lung, AJCC 8th Edition - Clinical stage from 04/30/2021: Stage IVA (cT2a, cN2, cM1b) - Signed by Lloyd Huger, MD on 04/30/2021  Lloyd Huger, MD   05/09/2021 7:25 AM

## 2021-05-07 NOTE — Patient Instructions (Addendum)
[facility]   Discharge Instructions: Thank you for choosing [facility] to provide your oncology and hematology care.  If you have a lab appointment with the [facility], please go directly to the [facility] and check in at the registration area.  Wear comfortable clothing and clothing appropriate for easy access to any Portacath or PICC line.   We strive to give you quality time with your provider. You may need to reschedule your appointment if you arrive late (15 or more minutes).  Arriving late affects you and other patients whose appointments are after yours.  Also, if you miss three or more appointments without notifying the office, you may be dismissed from the clinic at the provider's discretion.      For prescription refill requests, have your pharmacy contact our office and allow 72 hours for refills to be completed.    Today you received the following chemotherapy and/or immunotherapy agents Bevacizumab-bvze, Paclitaxel, Carboplatin  Carboplatin injection What is this medication? CARBOPLATIN (KAR boe pla tin) is a chemotherapy drug. It targets fast dividing cells, like cancer cells, and causes these cells to die. This medicine is usedto treat ovarian cancer and many other cancers. This medicine may be used for other purposes; ask your health care provider orpharmacist if you have questions. COMMON BRAND NAME(S): Paraplatin What should I tell my care team before I take this medication? They need to know if you have any of these conditions: blood disorders hearing problems kidney disease recent or ongoing radiation therapy an unusual or allergic reaction to carboplatin, cisplatin, other chemotherapy, other medicines, foods, dyes, or preservatives pregnant or trying to get pregnant breast-feeding How should I use this medication? This drug is usually given as an infusion into a vein. It is administered in [place] or clinic  by a specially trained health care professional. Talk to your pediatrician regarding the use of this medicine in children.Special care may be needed. Overdosage: If you think you have taken too much of this medicine contact apoison control center or emergency room at once. NOTE: This medicine is only for you. Do not share this medicine with others. What if I miss a dose? It is important not to miss a dose. Call your doctor or health careprofessional if you are unable to keep an appointment. What may interact with this medication? medicines for seizures medicines to increase blood counts like filgrastim, pegfilgrastim, sargramostim some antibiotics like amikacin, gentamicin, neomycin, streptomycin, tobramycin vaccines Talk to your doctor or health care professional before taking any of thesemedicines: acetaminophen aspirin ibuprofen ketoprofen naproxen This list may not describe all possible interactions. Give your health care provider a list of all the medicines, herbs, non-prescription drugs, or dietary supplements you use. Also tell them if you smoke, drink alcohol, or use illegaldrugs. Some items may interact with your medicine. What should I watch for while using this medication? Your condition will be monitored carefully while you are receiving this medicine. You will need important blood work done while you are taking thismedicine. This drug may make you feel generally unwell. This is not uncommon, as chemotherapy can affect healthy cells as well as cancer cells. Report any side effects. Continue your course of treatment even though you feel ill unless yourdoctor tells you to stop. In some cases, you may be given additional medicines to help with side effects.Follow all directions for their use. Call your doctor or health care professional for advice if you get a fever, chills  or sore throat, or other symptoms of a cold or flu. Do not treat yourself. This drug decreases your body's  ability to fight infections. Try toavoid being around people who are sick. This medicine may increase your risk to bruise or bleed. Call your doctor orhealth care professional if you notice any unusual bleeding. Be careful brushing and flossing your teeth or using a toothpick because you may get an infection or bleed more easily. If you have any dental work done,tell your dentist you are receiving this medicine. Avoid taking products that contain aspirin, acetaminophen, ibuprofen, naproxen, or ketoprofen unless instructed by your doctor. These medicines may hide afever. Do not become pregnant while taking this medicine. Women should inform their doctor if they wish to become pregnant or think they might be pregnant. There is a potential for serious side effects to an unborn child. Talk to your health care professional or pharmacist for more information. Do not breast-feed aninfant while taking this medicine. What side effects may I notice from receiving this medication? Side effects that you should report to your doctor or health care professionalas soon as possible: allergic reactions like skin rash, itching or hives, swelling of the face, lips, or tongue signs of infection - fever or chills, cough, sore throat, pain or difficulty passing urine signs of decreased platelets or bleeding - bruising, pinpoint red spots on the skin, black, tarry stools, nosebleeds signs of decreased red blood cells - unusually weak or tired, fainting spells, lightheadedness breathing problems changes in hearing changes in vision chest pain high blood pressure low blood counts - This drug may decrease the number of white blood cells, red blood cells and platelets. You may be at increased risk for infections and bleeding. nausea and vomiting pain, swelling, redness or irritation at the injection site pain, tingling, numbness in the hands or feet problems with balance, talking, walking trouble passing urine or change  in the amount of urine Side effects that usually do not require medical attention (report to yourdoctor or health care professional if they continue or are bothersome): hair loss loss of appetite metallic taste in the mouth or changes in taste This list may not describe all possible side effects. Call your doctor for medical advice about side effects. You may report side effects to FDA at1-800-FDA-1088. Where should I keep my medication? This drug is given in a hospital or clinic and will not be stored at home. NOTE: This sheet is a summary. It may not cover all possible information. If you have questions about this medicine, talk to your doctor, pharmacist, orhealth care provider.  2022 Elsevier/Gold Standard (2008-02-07 14:38:05) Paclitaxel injection What is this medication? PACLITAXEL (PAK li TAX el) is a chemotherapy drug. It targets fast dividing cells, like cancer cells, and causes these cells to die. This medicine is used to treat ovarian cancer, breast cancer, lung cancer, Kaposi's sarcoma, andother cancers. This medicine may be used for other purposes; ask your health care provider orpharmacist if you have questions. COMMON BRAND NAME(S): Onxol, Taxol What should I tell my care team before I take this medication? They need to know if you have any of these conditions: history of irregular heartbeat liver disease low blood counts, like low white cell, platelet, or red cell counts lung or breathing disease, like asthma tingling of the fingers or toes, or other nerve disorder an unusual or allergic reaction to paclitaxel, alcohol, polyoxyethylated castor oil, other chemotherapy, other medicines, foods, dyes, or preservatives pregnant or trying to get  pregnant breast-feeding How should I use this medication? This drug is given as an infusion into a vein. It is administered in a hospitalor clinic by a specially trained health care professional. Talk to your pediatrician regarding the  use of this medicine in children.Special care may be needed. Overdosage: If you think you have taken too much of this medicine contact apoison control center or emergency room at once. NOTE: This medicine is only for you. Do not share this medicine with others. What if I miss a dose? It is important not to miss your dose. Call your doctor or health careprofessional if you are unable to keep an appointment. What may interact with this medication? Do not take this medicine with any of the following medications: live virus vaccines This medicine may also interact with the following medications: antiviral medicines for hepatitis, HIV or AIDS certain antibiotics like erythromycin and clarithromycin certain medicines for fungal infections like ketoconazole and itraconazole certain medicines for seizures like carbamazepine, phenobarbital, phenytoin gemfibrozil nefazodone rifampin St. John's wort This list may not describe all possible interactions. Give your health care provider a list of all the medicines, herbs, non-prescription drugs, or dietary supplements you use. Also tell them if you smoke, drink alcohol, or use illegaldrugs. Some items may interact with your medicine. What should I watch for while using this medication? Your condition will be monitored carefully while you are receiving this medicine. You will need important blood work done while you are taking thismedicine. This medicine can cause serious allergic reactions. To reduce your risk you will need to take other medicine(s) before treatment with this medicine. If you experience allergic reactions like skin rash, itching or hives, swelling of theface, lips, or tongue, tell your doctor or health care professional right away. In some cases, you may be given additional medicines to help with side effects.Follow all directions for their use. This drug may make you feel generally unwell. This is not uncommon, as chemotherapy can affect  healthy cells as well as cancer cells. Report any side effects. Continue your course of treatment even though you feel ill unless yourdoctor tells you to stop. Call your doctor or health care professional for advice if you get a fever, chills or sore throat, or other symptoms of a cold or flu. Do not treat yourself. This drug decreases your body's ability to fight infections. Try toavoid being around people who are sick. This medicine may increase your risk to bruise or bleed. Call your doctor orhealth care professional if you notice any unusual bleeding. Be careful brushing and flossing your teeth or using a toothpick because you may get an infection or bleed more easily. If you have any dental work done,tell your dentist you are receiving this medicine. Avoid taking products that contain aspirin, acetaminophen, ibuprofen, naproxen, or ketoprofen unless instructed by your doctor. These medicines may hide afever. Do not become pregnant while taking this medicine. Women should inform their doctor if they wish to become pregnant or think they might be pregnant. There is a potential for serious side effects to an unborn child. Talk to your health care professional or pharmacist for more information. Do not breast-feed aninfant while taking this medicine. Men are advised not to father a child while receiving this medicine. This product may contain alcohol. Ask your pharmacist or healthcare provider if this medicine contains alcohol. Be sure to tell all healthcare providers you are taking this medicine. Certain medicines, like metronidazole and disulfiram, can cause an unpleasant reaction when  taken with alcohol. The reaction includes flushing, headache, nausea, vomiting, sweating, and increased thirst. Thereaction can last from 30 minutes to several hours. What side effects may I notice from receiving this medication? Side effects that you should report to your doctor or health care professionalas soon as  possible: allergic reactions like skin rash, itching or hives, swelling of the face, lips, or tongue breathing problems changes in vision fast, irregular heartbeat high or low blood pressure mouth sores pain, tingling, numbness in the hands or feet signs of decreased platelets or bleeding - bruising, pinpoint red spots on the skin, black, tarry stools, blood in the urine signs of decreased red blood cells - unusually weak or tired, feeling faint or lightheaded, falls signs of infection - fever or chills, cough, sore throat, pain or difficulty passing urine signs and symptoms of liver injury like dark yellow or brown urine; general ill feeling or flu-like symptoms; light-colored stools; loss of appetite; nausea; right upper belly pain; unusually weak or tired; yellowing of the eyes or skin swelling of the ankles, feet, hands unusually slow heartbeat Side effects that usually do not require medical attention (report to yourdoctor or health care professional if they continue or are bothersome): diarrhea hair loss loss of appetite muscle or joint pain nausea, vomiting pain, redness, or irritation at site where injected tiredness This list may not describe all possible side effects. Call your doctor for medical advice about side effects. You may report side effects to FDA at1-800-FDA-1088. Where should I keep my medication? This drug is given in a hospital or clinic and will not be stored at home. NOTE: This sheet is a summary. It may not cover all possible information. If you have questions about this medicine, talk to your doctor, pharmacist, orhealth care provider.  2022 Elsevier/Gold Standard (2019-10-04 13:37:23)    To help prevent nausea and vomiting after your treatment, we encourage you to take your nausea medication as directed.  BELOW ARE SYMPTOMS THAT SHOULD BE REPORTED IMMEDIATELY: *FEVER GREATER THAN 100.4 F (38 C) OR HIGHER *CHILLS OR SWEATING *NAUSEA AND VOMITING THAT IS  NOT CONTROLLED WITH YOUR NAUSEA MEDICATION *UNUSUAL SHORTNESS OF BREATH *UNUSUAL BRUISING OR BLEEDING *URINARY PROBLEMS (pain or burning when urinating, or frequent urination) *BOWEL PROBLEMS (unusual diarrhea, constipation, pain near the anus) TENDERNESS IN MOUTH AND THROAT WITH OR WITHOUT PRESENCE OF ULCERS (sore throat, sores in mouth, or a toothache) UNUSUAL RASH, SWELLING OR PAIN  UNUSUAL VAGINAL DISCHARGE OR ITCHING   Items with * indicate a potential emergency and should be followed up as soon as possible or go to the Emergency Department if any problems should occur.  Please show the CHEMOTHERAPY ALERT CARD or IMMUNOTHERAPY ALERT CARD at check-in to the Emergency Department and triage nurse.  Should you have questions after your visit or need to cancel or reschedule your appointment, please contact [facility]  [phone] and follow the prompts.  Office hours are 8:00 a.m. to 4:30 p.m. Monday - Friday. Please note that voicemails left after 4:00 p.m. may not be returned until the following business day.  We are closed weekends and major holidays. You have access to a nurse at all times for urgent questions. Please call the main number to the clinic [phone] and follow the prompts.  For any non-urgent questions, you may also contact your provider using MyChart. We now offer e-Visits for anyone 98 and older to request care online for non-urgent symptoms. For details visit [email or web address].  Also download the MyChart app! Go to the app store, search "MyChart", open the app, select [facility], and log in with your MyChart username and password.  Due to Covid, a mask is required upon entering the hospital/clinic. If you do not have a mask, one will be given to you upon arrival. For doctor visits, patients may have 1 support person aged 55 or older with them. For treatment visits, patients cannot have anyone with them due to current Covid  guidelines and our immunocompromised population.   Bevacizumab injection What is this medication? BEVACIZUMAB (be va SIZ yoo mab) is a monoclonal antibody. It is used to treatmany types of cancer. This medicine may be used for other purposes; ask your health care provider orpharmacist if you have questions. COMMON BRAND NAME(S): Avastin, MVASI, Noah Charon What should I tell my care team before I take this medication? They need to know if you have any of these conditions: diabetes heart disease high blood pressure history of coughing up blood prior anthracycline chemotherapy (e.g., doxorubicin, daunorubicin, epirubicin) recent or ongoing radiation therapy recent or planning to have surgery stroke an unusual or allergic reaction to bevacizumab, hamster proteins, mouse proteins, other medicines, foods, dyes, or preservatives pregnant or trying to get pregnant breast-feeding How should I use this medication? This medicine is for infusion into a vein. It is given by a health careprofessional in a hospital or clinic setting. Talk to your pediatrician regarding the use of this medicine in children.Special care may be needed. Overdosage: If you think you have taken too much of this medicine contact apoison control center or emergency room at once. NOTE: This medicine is only for you. Do not share this medicine with others. What if I miss a dose? It is important not to miss your dose. Call your doctor or health careprofessional if you are unable to keep an appointment. What may interact with this medication? Interactions are not expected. This list may not describe all possible interactions. Give your health care provider a list of all the medicines, herbs, non-prescription drugs, or dietary supplements you use. Also tell them if you smoke, drink alcohol, or use illegaldrugs. Some items may interact with your medicine. What should I watch for while using this medication? Your condition will be  monitored carefully while you are receiving this medicine. You will need important blood work and urine testing done while youare taking this medicine. This medicine may increase your risk to bruise or bleed. Call your doctor orhealth care professional if you notice any unusual bleeding. Before having surgery, talk to your health care provider to make sure it is ok. This drug can increase the risk of poor healing of your surgical site or wound. You will need to stop this drug for 28 days before surgery. After surgery, wait at least 28 days before restarting this drug. Make sure the surgical site or wound is healed enough before restarting this drug. Talk to your health careprovider if questions. Do not become pregnant while taking this medicine or for 6 months after stopping it. Women should inform their doctor if they wish to become pregnant or think they might be pregnant. There is a potential for serious side effects to an unborn child. Talk to your health care professional or pharmacist for more information. Do not breast-feed an infant while taking this medicine andfor 6 months after the last dose. This medicine has caused ovarian failure in some women. This medicine may interfere with the ability to have a child.  You should talk to your doctor orhealth care professional if you are concerned about your fertility. What side effects may I notice from receiving this medication? Side effects that you should report to your doctor or health care professionalas soon as possible: allergic reactions like skin rash, itching or hives, swelling of the face, lips, or tongue chest pain or chest tightness chills coughing up blood high fever seizures severe constipation signs and symptoms of bleeding such as bloody or black, tarry stools; red or dark-brown urine; spitting up blood or brown material that looks like coffee grounds; red spots on the skin; unusual bruising or bleeding from the eye, gums, or  nose signs and symptoms of a blood clot such as breathing problems; chest pain; severe, sudden headache; pain, swelling, warmth in the leg signs and symptoms of a stroke like changes in vision; confusion; trouble speaking or understanding; severe headaches; sudden numbness or weakness of the face, arm or leg; trouble walking; dizziness; loss of balance or coordination stomach pain sweating swelling of legs or ankles vomiting weight gain Side effects that usually do not require medical attention (report to yourdoctor or health care professional if they continue or are bothersome): back pain changes in taste decreased appetite dry skin nausea tiredness This list may not describe all possible side effects. Call your doctor for medical advice about side effects. You may report side effects to FDA at1-800-FDA-1088. Where should I keep my medication? This drug is given in a hospital or clinic and will not be stored at home. NOTE: This sheet is a summary. It may not cover all possible information. If you have questions about this medicine, talk to your doctor, pharmacist, orhealth care provider.  2022 Elsevier/Gold Standard (2019-08-30 10:50:46)

## 2021-05-08 ENCOUNTER — Encounter: Payer: Self-pay | Admitting: Oncology

## 2021-05-08 ENCOUNTER — Ambulatory Visit: Payer: Medicare Other

## 2021-05-08 ENCOUNTER — Other Ambulatory Visit: Payer: Medicare Other

## 2021-05-08 ENCOUNTER — Ambulatory Visit: Admission: RE | Admit: 2021-05-08 | Payer: Medicare Other | Source: Ambulatory Visit

## 2021-05-08 ENCOUNTER — Ambulatory Visit
Admission: RE | Admit: 2021-05-08 | Discharge: 2021-05-08 | Disposition: A | Discharge status: Home / Self Care | Payer: Medicare Other | Source: Ambulatory Visit | Attending: Radiation Oncology | Admitting: Radiation Oncology

## 2021-05-08 DIAGNOSIS — C3402 Malignant neoplasm of left main bronchus: Secondary | ICD-10-CM | POA: Diagnosis not present

## 2021-05-08 DIAGNOSIS — R918 Other nonspecific abnormal finding of lung field: Secondary | ICD-10-CM | POA: Diagnosis not present

## 2021-05-08 DIAGNOSIS — C7931 Secondary malignant neoplasm of brain: Secondary | ICD-10-CM | POA: Diagnosis not present

## 2021-05-09 ENCOUNTER — Encounter: Payer: Self-pay | Admitting: Oncology

## 2021-05-09 ENCOUNTER — Ambulatory Visit: Payer: Medicare Other

## 2021-05-09 ENCOUNTER — Ambulatory Visit
Admission: RE | Admit: 2021-05-09 | Discharge: 2021-05-09 | Disposition: A | Discharge status: Home / Self Care | Payer: Medicare Other | Source: Ambulatory Visit | Attending: Radiation Oncology | Admitting: Radiation Oncology

## 2021-05-09 ENCOUNTER — Inpatient Hospital Stay: Payer: Medicare Other

## 2021-05-09 ENCOUNTER — Other Ambulatory Visit: Payer: Self-pay

## 2021-05-09 DIAGNOSIS — C3402 Malignant neoplasm of left main bronchus: Secondary | ICD-10-CM | POA: Diagnosis not present

## 2021-05-09 DIAGNOSIS — Z5112 Encounter for antineoplastic immunotherapy: Secondary | ICD-10-CM | POA: Diagnosis not present

## 2021-05-09 DIAGNOSIS — C3491 Malignant neoplasm of unspecified part of right bronchus or lung: Secondary | ICD-10-CM

## 2021-05-09 DIAGNOSIS — C7931 Secondary malignant neoplasm of brain: Secondary | ICD-10-CM | POA: Diagnosis not present

## 2021-05-09 DIAGNOSIS — R918 Other nonspecific abnormal finding of lung field: Secondary | ICD-10-CM | POA: Diagnosis not present

## 2021-05-09 MED ORDER — PEGFILGRASTIM-JMDB 6 MG/0.6ML ~~LOC~~ SOSY
6.0000 mg | PREFILLED_SYRINGE | Freq: Once | SUBCUTANEOUS | Status: AC
  Administered 2021-05-09: 6 mg via SUBCUTANEOUS
  Filled 2021-05-09: qty 0.6

## 2021-05-09 NOTE — Progress Notes (Addendum)
Tumor Board Documentation  Matthew Kelly was presented by Dr Grayland Ormond at our Tumor Board on 05/08/2021, which included representatives from radiation oncology, medical oncology, surgical, radiology, pathology, navigation, internal medicine, palliative care, research, genetics, pharmacy, pulmonology.  Matthew Kelly currently presents as a new patient, for new positive pathology, for Matthew Kelly with history of the following treatments: surgical intervention(s), active survellience.  Additionally, we reviewed previous medical and familial history, history of present illness, and recent lab results along with all available histopathologic and imaging studies. The tumor board considered available treatment options and made the following recommendations: Brain XRT, systemic chemotherapy    The following procedures/referrals were also placed: No orders of the defined types were placed in this encounter.   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  Tumor board is a meeting of clinicians from various specialty areas who evaluate and discuss patients for whom a multidisciplinary approach is being considered. Final determinations in the plan of care are those of the provider(s). The responsibility for follow up of recommendations given during tumor board is that of the provider.   Today's extended care, comprehensive team conference, Matthew Kelly was not present for the discussion and was not examined.   Multidisciplinary Tumor Board is a multidisciplinary case peer review process.  Decisions discussed in the Multidisciplinary Tumor Board reflect the opinions of the specialists present at the conference without having examined the patient.  Ultimately, treatment and diagnostic decisions rest with the  primary provider(s) and the patient.

## 2021-05-10 NOTE — Progress Notes (Addendum)
[place]  [phone] [phone]) [phone]  ID: Enedina Finner OB: 09/25/1956  MR#: [id number]  ENM#:[id number]  Patient Care Team: Josetta Huddle, MD as PCP - General (Internal Medicine) Garlan Fair, MD as Consulting Physician (Gastroenterology) Telford Nab, RN as Oncology Nurse Navigator [name], MD as Consulting Physician (Pulmonary Disease)  CHIEF COMPLAINT: Stage IVA adenocarcinoma of the lung with brain metastasis.  INTERVAL HISTORY: Patient returns to clinic today to assess his toleration of cycle 1 of carboplatinum, Taxol, and Avastin.  He tolerated his treatment well without significant side effects.  He continues to be anxious.  His peripheral edema continues to improve as he tapers off his dexamethasone.  He has no neurologic complaints. He denies any recent fevers or illnesses.  He has no chest pain, shortness of breath, cough, or hemoptysis.  He denies any nausea, vomiting, constipation, or diarrhea.  He has no urinary complaints.  Patient offers no further specific complaints today.  REVIEW OF SYSTEMS:   Review of Systems  Constitutional: Negative.  Negative for fever, malaise/fatigue and weight loss.  Eyes: Negative.  Negative for blurred vision.  Respiratory: Negative.  Negative for cough, hemoptysis and shortness of breath.   Cardiovascular:  Positive for leg swelling. Negative for chest pain.  Gastrointestinal: Negative.  Negative for abdominal pain.  Genitourinary: Negative.  Negative for dysuria.  Musculoskeletal: Negative.  Negative for back pain.  Skin: Negative.  Negative for rash.  Neurological: Negative.  Negative for focal weakness, weakness and headaches.  Psychiatric/Behavioral:  The patient is nervous/anxious and has insomnia.    As per HPI. Otherwise, a complete review of systems is negative.  PAST MEDICAL HISTORY: Past Medical History:  Diagnosis Date   Anxiety    Aortic atherosclerosis ([place])    Aortic  stenosis    s/p AVR in 05/2013   Arthritis    Bicuspid aortic valve    Bronchogenic carcinoma of right lung ([place])    MRI confirmed brain metastasis   CHF (congestive heart failure) (HCC)    COPD (chronic obstructive pulmonary disease) (HCC)    DDD (degenerative disc disease), lumbar    Dysplastic nevus 12/30/2020   R post waistline - moderate   Erectile dysfunction    GERD (gastroesophageal reflux disease)    Heart murmur    Hepatitis C    dx'd 1980s.  treated with Harvoni approx 2015   HLD (hyperlipidemia)    Homonymous hemianopia, left    Hypertension    Hypogonadism in male    Insomnia    Pericarditis    Spinal stenosis, lumbar 07/25/2019   severe   Squamous cell carcinoma of skin 12/18/2019   SCCIS L lat ankle   Squamous cell carcinoma of skin 11/07/2008   SCCIS L wrist   Thoracic aortic aneurysm [facility])    s/p aortic arch/hemi-arch repair in 05/2013    PAST SURGICAL HISTORY: Past Surgical History:  Procedure Laterality Date   AORTIC VALVE REPLACEMENT N/A 06/01/2013   Procedure: REPLACEMENT AORTIC VALVE; Surgeon: Gretchen Portela, MD; Location: [facility]; Service: Cardiothoracic; Laterality: N/A; Bioprosthetic 25 mm [employer] Jude trifecta bovine pericardial tissue valve   CATARACT EXTRACTION W/PHACO Right 12/17/2020   Procedure: CATARACT EXTRACTION PHACO AND INTRAOCULAR LENS PLACEMENT ([place]) RIGHT 6.67 00:49.5;  Surgeon: Birder Robson, MD;  Location: [place];  Service: Ophthalmology;  Laterality: Right;   COLONOSCOPY WITH PROPOFOL N/A 01/04/2017   Procedure: COLONOSCOPY WITH PROPOFOL;  Surgeon: Garlan Fair, MD;  Location: [facility];  Service:  Endoscopy;  Laterality: N/A;   IR IMAGING GUIDED PORT INSERTION  04/11/2021   ORCHIECTOMY Left 2000   REPAIR THORACIC AORTA N/A 06/01/2013   Procedure: REPAIR THORACIC AORTIC ANEURYSM W/TRANSVERSE ARCH GRAFT - HemiArch; Surgeon: Gretchen Portela, MD; Location: [facility]; Service:  Cardiothoracic; Laterality: N/A; #24 mm Dacron graft   VIDEO BRONCHOSCOPY WITH ENDOBRONCHIAL NAVIGATION N/A 04/18/2021   Procedure: VIDEO BRONCHOSCOPY WITH ENDOBRONCHIAL NAVIGATION;  Surgeon: [name], MD;  Location: [facility];  Service: Thoracic;  Laterality: N/A;   VIDEO BRONCHOSCOPY WITH ENDOBRONCHIAL ULTRASOUND N/A 04/18/2021   Procedure: VIDEO BRONCHOSCOPY WITH ENDOBRONCHIAL ULTRASOUND;  Surgeon: [name], MD;  Location: [facility];  Service: Thoracic;  Laterality: N/A;    FAMILY HISTORY: Family History  Problem Relation Age of Onset   Cancer Mother    Heart disease Father     ADVANCED DIRECTIVES (Y/N):  N  HEALTH MAINTENANCE: Social History   Tobacco Use   Smoking status: Every Day    Packs/day: 0.50    Pack years: 0.00    Types: Cigarettes    Start date: 11/16/1978   Smokeless tobacco: Never  Vaping Use   Vaping Use: Never used  Substance Use Topics   Alcohol use: Yes    Alcohol/week: 3.0 standard drinks    Types: 3 Standard drinks or equivalent per week    Comment: occasional   Drug use: No     Colonoscopy:  PAP:  Bone density:  Lipid panel:  Allergies  Allergen Reactions   Avelox [Moxifloxacin] Other (See Comments)    dizziness   Escitalopram Other (See Comments)   Lexapro [Escitalopram Oxalate] Other (See Comments)    Unsure of reaction   Rifampin Other (See Comments)    Unsure of reaction   Wellbutrin [Bupropion]     Anxiety/depression     Current Outpatient Medications  Medication Sig Dispense Refill   Acetylcysteine (NAC) 600 MG CAPS Take 600 mg by mouth in the morning.     Alpha Lipoic Acid 200 MG CAPS Take 200 mg by mouth daily.     ALPRAZolam (XANAX) 0.5 MG tablet Take 0.5 mg by mouth daily as needed for anxiety.     Arginine (L-ARGININE-500) 500 MG CAPS Take 500 mg by mouth daily as needed (energy).     aspirin EC 81 MG tablet Take 81 mg by mouth in the morning. Swallow whole.     B Complex-C (B-COMPLEX WITH VITAMIN C) tablet Take 1  tablet by mouth daily.     Biotin 1000 MCG tablet Take 1,000 mcg by mouth daily.     Cholecalciferol (VITAMIN D3) 125 MCG (5000 UT) TABS Take 5,000 Units by mouth daily.     CINNAMON PO Take 6,000 mg by mouth in the morning. [place] Cinnamon     Coenzyme Q10 (COQ10) 100 MG CAPS Take 100 mg by mouth daily.     Cyanocobalamin (VITAMIN B-12) 3000 MCG SUBL Place 3,000 mcg under the tongue daily.     cyclobenzaprine (FLEXERIL) 10 MG tablet Take 10 mg by mouth 3 (three) times daily as needed for muscle spasms.     dexamethasone (DECADRON) 4 MG tablet Take 1 tablet (4 mg total) by mouth 2 (two) times daily with a meal. (Patient taking differently: Take 4 mg by mouth daily.) 60 tablet 1   Dextran 70-Hypromellose, PF, (TEARS NATURALE FREE) 0.1-0.3 % SOLN Place 1-2 drops into both eyes 3 (three) times daily as needed (dry/irritated eyes.).     dicyclomine (BENTYL) 20 MG  tablet Take 20-40 mg by mouth daily as needed for spasms.     diphenhydrAMINE HCl (ZZZQUIL) 50 MG/30ML LIQD Take 15-30 mLs by mouth at bedtime as needed (sleep).     diphenoxylate-atropine (LOMOTIL) 2.5-0.025 MG per tablet Take 1 tablet by mouth 4 (four) times daily as needed for diarrhea or loose stools.     Ginkgo Biloba 120 MG TABS Take 120 mg by mouth in the morning.     guaifenesin (HUMIBID E) 400 MG TABS tablet Take 400 mg by mouth 2 (two) times daily as needed (congestion/allergies).     hydrocortisone (ANUSOL-HC) 2.5 % rectal cream Apply topically 2 (two) times daily as needed for hemorrhoids or anal itching.     lidocaine-prilocaine (EMLA) cream Apply to affected area once 30 g 3   loratadine (CLARITIN) 10 MG tablet Take 10 mg by mouth daily.     losartan (COZAAR) 100 MG tablet Take 100 mg by mouth in the morning.     Lutein-Zeaxanthin 20-1 MG CAPS Take 1 capsule by mouth in the morning.     melatonin 5 MG TABS Take 5 mg by mouth at bedtime as needed (sleep).     metoprolol (LOPRESSOR) 50 MG tablet Take 50 mg by mouth 2 (two) times  daily.     milk thistle 175 MG tablet Take 175 mg by mouth in the morning.     Multiple Vitamin (MULTIVITAMIN WITH MINERALS) TABS tablet Take 1 tablet by mouth daily. Men's 50+ Multivitamin     Naphazoline-Pheniramine (OPCON-A) 0.027-0.315 % SOLN Place 1 drop into both eyes 3 (three) times daily as needed (dry/irritated eyes.).     naproxen sodium (ALEVE) 220 MG tablet Take 220 mg by mouth daily as needed (pain).     Omega-3 Fatty Acids (FISH OIL) 1200 MG CAPS Take 1,200 mg by mouth daily.     omeprazole (PRILOSEC) 20 MG capsule Take 20 mg by mouth in the morning.     ondansetron (ZOFRAN) 8 MG tablet Take 1 tablet (8 mg total) by mouth 2 (two) times daily as needed for refractory nausea / vomiting. Start on day 3 after carboplatin chemo. 60 tablet 1   oxyCODONE (OXY IR/ROXICODONE) 5 MG immediate release tablet Take 10 mg by mouth 2 (two) times daily as needed for severe pain (spinal stenosis pain.).     pregabalin (LYRICA) 50 MG capsule Take 50 mg by mouth daily as needed (feet neuropathy).     prochlorperazine (COMPAZINE) 10 MG tablet Take 1 tablet (10 mg total) by mouth every 6 (six) hours as needed (Nausea or vomiting). 60 tablet 1   Saw Palmetto 450 MG CAPS Take 450 mg by mouth daily.     Selenium (SELENIMIN-200 PO) Take 200 mg by mouth daily.     simethicone (MYLICON) 387 MG chewable tablet Chew 125 mg by mouth in the morning.     simvastatin (ZOCOR) 20 MG tablet Take 20 mg by mouth daily before supper.     Testosterone 20.25 MG/ACT (1.62%) GEL APPLY 4 PUMPS DAILY     Testosterone 40.5 MG/2.5GM (1.62%) GEL Place 4 Pump onto the skin in the morning. Applied to shoulders     traZODone (DESYREL) 50 MG tablet Take 50 mg by mouth at bedtime as needed for sleep.     triamcinolone (NASACORT) 55 MCG/ACT AERO nasal inhaler Place 2 sprays into the nose daily.     TURMERIC CURCUMIN PO Take 1,500 mg by mouth daily.     umeclidinium-vilanterol (ANORO ELLIPTA) 62.5-25 MCG/INH  AEPB Inhale 1 puff into the  lungs daily as needed (respiratory issues.).     varenicline (CHANTIX STARTING MONTH PAK) 0.5 MG X 11 & 1 MG X 42 tablet Take one 0.5 mg tablet by mouth once daily for 3 days, then increase to one 0.5 mg tablet twice daily for 4 days, then increase to one 1 mg tablet twice daily. 53 tablet 0   vitamin C (ASCORBIC ACID) 500 MG tablet Take 500 mg by mouth in the morning.     vitamin E 180 MG (400 UNITS) capsule Take 400 Units by mouth daily.     VOLTAREN 1 % GEL Apply 2 g topically 4 (four) times daily as needed (pain).  3   Zinc 50 MG TABS Take 50 mg by mouth daily.     zolpidem (AMBIEN) 10 MG tablet Take 10 mg by mouth at bedtime as needed for sleep.     dronabinol (MARINOL) 5 MG capsule Take by mouth. (Patient not taking: Reported on 05/14/2021)     mupirocin ointment (BACTROBAN) 2 % APPLY TO AFFECTED AREA EVERY DAY (Patient not taking: No sig reported) 22 g 4   No current facility-administered medications for this visit.    OBJECTIVE: Vitals:   05/14/21 1050  BP: 135/80  Pulse: 75  Resp: 20  Temp: 98.9 F (37.2 C)  SpO2: 96%     There is no height or weight on file to calculate BMI.    ECOG FS:0 - Asymptomatic  General: Well-developed, well-nourished, no acute distress. Eyes: Pink conjunctiva, anicteric sclera. HEENT: Normocephalic, moist mucous membranes. Lungs: No audible wheezing or coughing. Heart: Regular rate and rhythm. Abdomen: Soft, nontender, no obvious distention. Musculoskeletal: No edema, cyanosis, or clubbing. Neuro: Alert, answering all questions appropriately. Cranial nerves grossly intact. Skin: No rashes or petechiae noted. Psych: Normal affect.   LAB RESULTS:  Lab Results  Component Value Date   NA 140 05/14/2021   K 4.8 05/14/2021   CL 103 05/14/2021   CO2 30 05/14/2021   GLUCOSE 102 (H) 05/14/2021   BUN 16 05/14/2021   CREATININE 0.76 05/14/2021   CALCIUM 8.6 (L) 05/14/2021   PROT 6.2 (L) 05/14/2021   ALBUMIN 3.6 05/14/2021   AST 25 05/14/2021    ALT 35 05/14/2021   ALKPHOS 83 05/14/2021   BILITOT 0.9 05/14/2021   GFRNONAA >60 05/14/2021   GFRAA >89 08/02/2014    Lab Results  Component Value Date   WBC 18.8 (H) 05/14/2021   NEUTROABS 13.2 (H) 05/14/2021   HGB 13.3 05/14/2021   HCT 41.0 05/14/2021   MCV 96.5 05/14/2021   PLT 80 (L) 05/14/2021     STUDIES: DG Chest Port 1 View  Result Date: 04/18/2021 CLINICAL DATA:  Status post bronchoscopy EXAM: PORTABLE CHEST 1 VIEW COMPARISON:  Chest CT April 16, 2021 FINDINGS: No evident pneumothorax. There is ill-defined opacity in the medial aspect of the right upper lobe at the site known pulmonary nodule, much better delineated on recent CT. There is mild ill-defined opacity in the periphery of the right mid lung which could represent mild hemorrhage or effusion. Ill-defined nodular opacity in the right lower lung region seen on recent CT is not convincingly appreciable by radiography. On the left, there is mild lower lobe atelectatic change. No frank airspace consolidation noted. Port-A-Cath tip is at the cavoatrial junction. The heart size and pulmonary vascularity are normal. There is aortic atherosclerosis. Adenopathy noted in the right hilum on recent CT is not delineated by portable radiography.  Status median sternotomy. No bone lesions. Surgical clips in right axilla. IMPRESSION: No pneumothorax. Ill-defined opacity medial right upper lobe with known pulmonary nodular lesion in this area better delineated on recent CT. Question mild hemorrhage versus effusion in right mid lung. Ill-defined opacity right lower lobe seen on recent CT not appreciable by radiography. No edema or consolidation.  Mild left lower lobe atelectasis. Heart size normal. Adenopathy in the right hilar region seen on CT is not apparent portable chest radiography. Port-A-Cath tip at cavoatrial junction. Postoperative changes noted. Electronically Signed   By: Lowella Grip III M.D.   On: 04/18/2021 14:57   DG C-Arm  1-60 Min-No Report  Result Date: 04/18/2021 Fluoroscopy was utilized by the requesting physician.  No radiographic interpretation.   CT Super D Chest Wo Contrast  Result Date: 04/16/2021 CLINICAL DATA:  Upcoming endobronchial navigational biopsy on 06/03. Follow-up of previous PET evaluation from Mar 31, 2021 w. EXAM: CT CHEST WITHOUT CONTRAST TECHNIQUE: Multidetector CT imaging of the chest was performed using thin slice collimation for electromagnetic bronchoscopy planning purposes, without intravenous contrast. COMPARISON:  Mar 31, 2021 FINDINGS: Cardiovascular: Post median sternotomy. Calcified atheromatous plaque of the thoracic aorta signs of coronary artery calcification RIGHT and LEFT coronary circulation. Heart size stable without substantial pericardial fluid. Central pulmonary vasculature is of normal caliber. RIGHT-sided Port-A-Cath terminates in the upper RIGHT atrium. Limited assessment of cardiovascular structures given lack of intravenous contrast. Mediastinum/Nodes: Esophagus grossly normal. Bulky RIGHT hilar adenopathy shown to be hypermetabolic measures approximately 3.9 x 3.5 cm. No enlarged lymph nodes elsewhere in the chest. No axillary lymphadenopathy. No thoracic inlet lymphadenopathy. Lungs/Pleura: Irregular nodule in the RIGHT upper lobe (image 36/3) 1.7 x 1.2 cm similar to recent PET evaluation,z better displayed on current CT evaluation. Small nodule in the RIGHT middle lobe (image 122/3) 5 mm. Parenchymal distortion in the RIGHT lower lobe with septal thickening and associated ground-glass measuring up to 3.9 x 2.0 cm when measured in a similar fashion this measured approximately 3.7 x 1.8 cm on the study of June of 2020 LEFT chest is clear. Airways are patent aside from narrowing of RIGHT upper lobe bronchus by the RIGHT hilar mass. Upper Abdomen: Incidental imaging of upper abdominal contents without acute process. The adrenal glands are normal. Musculoskeletal: Visualized  clavicles and scapulae are unremarkable. Post median sternotomy for CABG. No destructive bone finding or acute bone process. Spinal degenerative changes. IMPRESSION: 1. Irregular RIGHT upper lobe nodule and RIGHT hilar adenopathy compatible with bronchogenic neoplasm as seen on recent PET-CT. 2. New nodule since June of 2020 in the RIGHT middle lobe and stable to slightly increased size of irregular area of nodularity and septal thickening in the RIGHT lower lobe could represent multifocal bronchogenic neoplasm, not shown to be hypermetabolic on the previous PET exam above mediastinal blood pool. Attention on follow-up. 3. Aortic atherosclerosis. Aortic Atherosclerosis (ICD10-I70.0). Electronically Signed   By: Zetta Bills M.D.   On: 04/16/2021 11:47     ASSESSMENT: Stage IVA adenocarcinoma of the lung with brain metastasis.  PLAN:    1. Stage IVA adenocarcinoma of the lung with brain metastasis: MRI of the brain from Mar 27, 2021 reviewed independently with a total of 16 lesions consistent with metastatic disease.  Subsequent PET scan on Mar 31, 2021 revealed a hypermetabolic pulmonary nodule in the right apex along with a large right suprahilar nodal mass.  Biopsy confirmed adenocarcinoma of lung origin.  Molecular studies do not appear to have any actionable mutations and  PD-L1 is less than 1%.  Patient is receiving carboplatinum, Taxol, and Avastin with Fulphilia support every 3 weeks for at least 4-6 cycles.  Patient tolerated cycle 1 of treatment last week.  Return to clinic in 2 weeks for further evaluation and consideration of cycle 2. 2.  Brain metastasis: Patient completed XRT on May 14, 2021. 3.  Peripheral edema/insomnia: Improving.  Patient has been instructed to dose reduce his Decadron to 2 mg once per day for 7 days and then discontinue. 4.  Leukocytosis: Likely secondary to steroids. 5.  Thrombocytopenia: Secondary to chemotherapy, monitor.   Patient expressed understanding and was  in agreement with this plan. He also understands that He can call clinic at any time with any questions, concerns, or complaints.   Cancer Staging Adenocarcinoma of right lung [facility]) Staging form: Lung, AJCC 8th Edition - Clinical stage from 04/30/2021: Stage IVA (cT2a, cN2, cM1b) - Signed by Lloyd Huger, MD on 04/30/2021  Lloyd Huger, MD   05/16/2021 11:08 AM

## 2021-05-12 ENCOUNTER — Ambulatory Visit
Admission: RE | Admit: 2021-05-12 | Discharge: 2021-05-12 | Disposition: A | Discharge status: Home / Self Care | Payer: Medicare Other | Source: Ambulatory Visit | Attending: Radiation Oncology | Admitting: Radiation Oncology

## 2021-05-12 DIAGNOSIS — C7931 Secondary malignant neoplasm of brain: Secondary | ICD-10-CM | POA: Diagnosis not present

## 2021-05-12 DIAGNOSIS — R918 Other nonspecific abnormal finding of lung field: Secondary | ICD-10-CM | POA: Diagnosis not present

## 2021-05-12 DIAGNOSIS — C3402 Malignant neoplasm of left main bronchus: Secondary | ICD-10-CM | POA: Diagnosis not present

## 2021-05-13 ENCOUNTER — Telehealth: Payer: Self-pay | Admitting: *Deleted

## 2021-05-13 ENCOUNTER — Ambulatory Visit: Payer: Medicare Other

## 2021-05-13 ENCOUNTER — Ambulatory Visit
Admission: RE | Admit: 2021-05-13 | Discharge: 2021-05-13 | Disposition: A | Discharge status: Home / Self Care | Payer: Medicare Other | Source: Ambulatory Visit | Attending: Radiation Oncology | Admitting: Radiation Oncology

## 2021-05-13 DIAGNOSIS — R918 Other nonspecific abnormal finding of lung field: Secondary | ICD-10-CM | POA: Diagnosis not present

## 2021-05-13 DIAGNOSIS — C7931 Secondary malignant neoplasm of brain: Secondary | ICD-10-CM | POA: Diagnosis not present

## 2021-05-13 NOTE — Telephone Encounter (Signed)
Pt called in to report is experiencing lower back pain that started today. Most likely related to fulphila injection received last Friday. Pt instructed to continue taking claritin daily and can alternate tylenol with oxycodone during the day for pain relief. Advised that may try heat or ice therapy as well if desires. Pt verbalized understanding.

## 2021-05-14 ENCOUNTER — Inpatient Hospital Stay (HOSPITAL_BASED_OUTPATIENT_CLINIC_OR_DEPARTMENT_OTHER): Payer: Medicare Other | Admitting: Oncology

## 2021-05-14 ENCOUNTER — Inpatient Hospital Stay: Payer: Medicare Other

## 2021-05-14 ENCOUNTER — Other Ambulatory Visit: Payer: Self-pay

## 2021-05-14 ENCOUNTER — Encounter: Payer: Self-pay | Admitting: *Deleted

## 2021-05-14 ENCOUNTER — Encounter: Payer: Self-pay | Admitting: Oncology

## 2021-05-14 ENCOUNTER — Ambulatory Visit
Admission: RE | Admit: 2021-05-14 | Discharge: 2021-05-14 | Disposition: A | Discharge status: Home / Self Care | Payer: Medicare Other | Source: Ambulatory Visit | Attending: Radiation Oncology | Admitting: Radiation Oncology

## 2021-05-14 VITALS — BP 135/80 | HR 75 | Temp 98.9°F | Resp 20

## 2021-05-14 DIAGNOSIS — R918 Other nonspecific abnormal finding of lung field: Secondary | ICD-10-CM | POA: Diagnosis not present

## 2021-05-14 DIAGNOSIS — C7931 Secondary malignant neoplasm of brain: Secondary | ICD-10-CM | POA: Diagnosis not present

## 2021-05-14 DIAGNOSIS — C3491 Malignant neoplasm of unspecified part of right bronchus or lung: Secondary | ICD-10-CM

## 2021-05-14 DIAGNOSIS — C3402 Malignant neoplasm of left main bronchus: Secondary | ICD-10-CM | POA: Diagnosis not present

## 2021-05-14 DIAGNOSIS — Z5112 Encounter for antineoplastic immunotherapy: Secondary | ICD-10-CM | POA: Diagnosis not present

## 2021-05-14 LAB — CBC WITH DIFFERENTIAL/PLATELET
Abs Immature Granulocytes: 1.16 10*3/uL — ABNORMAL HIGH (ref 0.00–0.07)
Basophils Absolute: 0 10*3/uL (ref 0.0–0.1)
Basophils Relative: 0 %
Eosinophils Absolute: 0.1 10*3/uL (ref 0.0–0.5)
Eosinophils Relative: 1 %
HCT: 41 % (ref 39.0–52.0)
Hemoglobin: 13.3 g/dL (ref 13.0–17.0)
Immature Granulocytes: 6 %
Lymphocytes Relative: 17 %
Lymphs Abs: 3.2 10*3/uL (ref 0.7–4.0)
MCH: 31.3 pg (ref 26.0–34.0)
MCHC: 32.4 g/dL (ref 30.0–36.0)
MCV: 96.5 fL (ref 80.0–100.0)
Monocytes Absolute: 1.1 10*3/uL — ABNORMAL HIGH (ref 0.1–1.0)
Monocytes Relative: 6 %
Neutro Abs: 13.2 10*3/uL — ABNORMAL HIGH (ref 1.7–7.7)
Neutrophils Relative %: 70 %
Platelets: 80 10*3/uL — ABNORMAL LOW (ref 150–400)
RBC: 4.25 MIL/uL (ref 4.22–5.81)
RDW: 17.7 % — ABNORMAL HIGH (ref 11.5–15.5)
Smear Review: NORMAL
WBC Morphology: INCREASED
WBC: 18.8 10*3/uL — ABNORMAL HIGH (ref 4.0–10.5)
nRBC: 0.2 % (ref 0.0–0.2)

## 2021-05-14 LAB — COMPREHENSIVE METABOLIC PANEL
ALT: 35 U/L (ref 0–44)
AST: 25 U/L (ref 15–41)
Albumin: 3.6 g/dL (ref 3.5–5.0)
Alkaline Phosphatase: 83 U/L (ref 38–126)
Anion gap: 7 (ref 5–15)
BUN: 16 mg/dL (ref 8–23)
CO2: 30 mmol/L (ref 22–32)
Calcium: 8.6 mg/dL — ABNORMAL LOW (ref 8.9–10.3)
Chloride: 103 mmol/L (ref 98–111)
Creatinine, Ser: 0.76 mg/dL (ref 0.61–1.24)
GFR, Estimated: >60 mL/min (ref >60)
Glucose, Bld: 102 mg/dL — ABNORMAL HIGH (ref 70–99)
Potassium: 4.8 mmol/L (ref 3.5–5.1)
Sodium: 140 mmol/L (ref 135–145)
Total Bilirubin: 0.9 mg/dL (ref 0.3–1.2)
Total Protein: 6.2 g/dL — ABNORMAL LOW (ref 6.5–8.1)

## 2021-05-14 LAB — URINALYSIS, DIPSTICK ONLY
Glucose, UA: NEGATIVE mg/dL
Hgb urine dipstick: NEGATIVE
Ketones, ur: 5 mg/dL — AB
Leukocytes,Ua: NEGATIVE
Nitrite: NEGATIVE
Protein, ur: 30 mg/dL — AB
Specific Gravity, Urine: 1.034 — ABNORMAL HIGH (ref 1.005–1.030)
pH: 6 (ref 5.0–8.0)

## 2021-05-16 ENCOUNTER — Encounter: Payer: Self-pay | Admitting: Oncology

## 2021-05-28 ENCOUNTER — Encounter: Payer: Self-pay | Admitting: Nurse Practitioner

## 2021-05-28 ENCOUNTER — Inpatient Hospital Stay (HOSPITAL_BASED_OUTPATIENT_CLINIC_OR_DEPARTMENT_OTHER): Payer: Medicare Other

## 2021-05-28 ENCOUNTER — Other Ambulatory Visit: Payer: Self-pay

## 2021-05-28 ENCOUNTER — Inpatient Hospital Stay: Payer: Medicare Other | Attending: Oncology

## 2021-05-28 ENCOUNTER — Encounter: Payer: Self-pay | Admitting: *Deleted

## 2021-05-28 ENCOUNTER — Inpatient Hospital Stay: Payer: Medicare Other | Admitting: Nurse Practitioner

## 2021-05-28 VITALS — BP 115/83 | HR 86 | Temp 98.9°F | Resp 18 | Wt 204.9 lb

## 2021-05-28 VITALS — BP 136/72 | HR 84 | Resp 16

## 2021-05-28 DIAGNOSIS — D6481 Anemia due to antineoplastic chemotherapy: Secondary | ICD-10-CM | POA: Diagnosis not present

## 2021-05-28 DIAGNOSIS — D72829 Elevated white blood cell count, unspecified: Secondary | ICD-10-CM | POA: Insufficient documentation

## 2021-05-28 DIAGNOSIS — Z5112 Encounter for antineoplastic immunotherapy: Secondary | ICD-10-CM | POA: Diagnosis not present

## 2021-05-28 DIAGNOSIS — Z5111 Encounter for antineoplastic chemotherapy: Secondary | ICD-10-CM | POA: Insufficient documentation

## 2021-05-28 DIAGNOSIS — Z79899 Other long term (current) drug therapy: Secondary | ICD-10-CM | POA: Diagnosis not present

## 2021-05-28 DIAGNOSIS — Z7982 Long term (current) use of aspirin: Secondary | ICD-10-CM | POA: Diagnosis not present

## 2021-05-28 DIAGNOSIS — C3490 Malignant neoplasm of unspecified part of unspecified bronchus or lung: Secondary | ICD-10-CM

## 2021-05-28 DIAGNOSIS — C7931 Secondary malignant neoplasm of brain: Secondary | ICD-10-CM | POA: Diagnosis not present

## 2021-05-28 DIAGNOSIS — C3491 Malignant neoplasm of unspecified part of right bronchus or lung: Secondary | ICD-10-CM

## 2021-05-28 DIAGNOSIS — T451X5A Adverse effect of antineoplastic and immunosuppressive drugs, initial encounter: Secondary | ICD-10-CM | POA: Insufficient documentation

## 2021-05-28 DIAGNOSIS — D6959 Other secondary thrombocytopenia: Secondary | ICD-10-CM | POA: Insufficient documentation

## 2021-05-28 DIAGNOSIS — Z5189 Encounter for other specified aftercare: Secondary | ICD-10-CM | POA: Insufficient documentation

## 2021-05-28 DIAGNOSIS — F17210 Nicotine dependence, cigarettes, uncomplicated: Secondary | ICD-10-CM | POA: Diagnosis not present

## 2021-05-28 DIAGNOSIS — R059 Cough, unspecified: Secondary | ICD-10-CM | POA: Insufficient documentation

## 2021-05-28 DIAGNOSIS — J069 Acute upper respiratory infection, unspecified: Secondary | ICD-10-CM | POA: Insufficient documentation

## 2021-05-28 LAB — URINALYSIS, DIPSTICK ONLY
Bilirubin Urine: NEGATIVE
Glucose, UA: NEGATIVE mg/dL
Hgb urine dipstick: NEGATIVE
Ketones, ur: 5 mg/dL — AB
Leukocytes,Ua: NEGATIVE
Nitrite: NEGATIVE
Protein, ur: NEGATIVE mg/dL
Specific Gravity, Urine: 1.024 (ref 1.005–1.030)
pH: 5 (ref 5.0–8.0)

## 2021-05-28 LAB — COMPREHENSIVE METABOLIC PANEL
ALT: 28 U/L (ref 0–44)
AST: 28 U/L (ref 15–41)
Albumin: 3.6 g/dL (ref 3.5–5.0)
Alkaline Phosphatase: 83 U/L (ref 38–126)
Anion gap: 8 (ref 5–15)
BUN: 11 mg/dL (ref 8–23)
CO2: 23 mmol/L (ref 22–32)
Calcium: 8.4 mg/dL — ABNORMAL LOW (ref 8.9–10.3)
Chloride: 104 mmol/L (ref 98–111)
Creatinine, Ser: 0.61 mg/dL (ref 0.61–1.24)
GFR, Estimated: >60 mL/min (ref >60)
Glucose, Bld: 126 mg/dL — ABNORMAL HIGH (ref 70–99)
Potassium: 4.2 mmol/L (ref 3.5–5.1)
Sodium: 135 mmol/L (ref 135–145)
Total Bilirubin: 0.8 mg/dL (ref 0.3–1.2)
Total Protein: 7 g/dL (ref 6.5–8.1)

## 2021-05-28 LAB — CBC WITH DIFFERENTIAL/PLATELET
Abs Immature Granulocytes: 0.09 10*3/uL — ABNORMAL HIGH (ref 0.00–0.07)
Basophils Absolute: 0.1 10*3/uL (ref 0.0–0.1)
Basophils Relative: 1 %
Eosinophils Absolute: 0 10*3/uL (ref 0.0–0.5)
Eosinophils Relative: 0 %
HCT: 37.6 % — ABNORMAL LOW (ref 39.0–52.0)
Hemoglobin: 12.6 g/dL — ABNORMAL LOW (ref 13.0–17.0)
Immature Granulocytes: 1 %
Lymphocytes Relative: 40 %
Lymphs Abs: 5.4 10*3/uL — ABNORMAL HIGH (ref 0.7–4.0)
MCH: 32.5 pg (ref 26.0–34.0)
MCHC: 33.5 g/dL (ref 30.0–36.0)
MCV: 96.9 fL (ref 80.0–100.0)
Monocytes Absolute: 1.3 10*3/uL — ABNORMAL HIGH (ref 0.1–1.0)
Monocytes Relative: 9 %
Neutro Abs: 6.8 10*3/uL (ref 1.7–7.7)
Neutrophils Relative %: 49 %
Platelets: 321 10*3/uL (ref 150–400)
RBC: 3.88 MIL/uL — ABNORMAL LOW (ref 4.22–5.81)
RDW: 19.3 % — ABNORMAL HIGH (ref 11.5–15.5)
WBC: 13.7 10*3/uL — ABNORMAL HIGH (ref 4.0–10.5)
nRBC: 0.1 % (ref 0.0–0.2)

## 2021-05-28 MED ORDER — SODIUM CHLORIDE 0.9 % IV SOLN
150.0000 mg | Freq: Once | INTRAVENOUS | Status: AC
  Administered 2021-05-28: 150 mg via INTRAVENOUS
  Filled 2021-05-28: qty 150

## 2021-05-28 MED ORDER — HEPARIN SOD (PORK) LOCK FLUSH 100 UNIT/ML IV SOLN
500.0000 [IU] | Freq: Once | INTRAVENOUS | Status: AC | PRN
  Administered 2021-05-28: 500 [IU]
  Filled 2021-05-28: qty 5

## 2021-05-28 MED ORDER — SODIUM CHLORIDE 0.9 % IV SOLN
200.0000 mg/m2 | Freq: Once | INTRAVENOUS | Status: AC
  Administered 2021-05-28: 456 mg via INTRAVENOUS
  Filled 2021-05-28: qty 76

## 2021-05-28 MED ORDER — SODIUM CHLORIDE 0.9 % IV SOLN
15.0000 mg/kg | Freq: Once | INTRAVENOUS | Status: AC
  Administered 2021-05-28: 1400 mg via INTRAVENOUS
  Filled 2021-05-28: qty 48

## 2021-05-28 MED ORDER — SODIUM CHLORIDE 0.9 % IV SOLN
748.2000 mg | Freq: Once | INTRAVENOUS | Status: AC
  Administered 2021-05-28: 750 mg via INTRAVENOUS
  Filled 2021-05-28: qty 75

## 2021-05-28 MED ORDER — FAMOTIDINE 20 MG IN NS 100 ML IVPB
20.0000 mg | Freq: Once | INTRAVENOUS | Status: AC
  Administered 2021-05-28: 20 mg via INTRAVENOUS
  Filled 2021-05-28: qty 20

## 2021-05-28 MED ORDER — SODIUM CHLORIDE 0.9 % IV SOLN
Freq: Once | INTRAVENOUS | Status: AC
  Filled 2021-05-28: qty 250

## 2021-05-28 MED ORDER — PALONOSETRON HCL INJECTION 0.25 MG/5ML
0.2500 mg | Freq: Once | INTRAVENOUS | Status: AC
Start: 2021-05-28 — End: 2021-05-28
  Administered 2021-05-28: 0.25 mg via INTRAVENOUS
  Filled 2021-05-28: qty 5

## 2021-05-28 MED ORDER — SODIUM CHLORIDE 0.9% FLUSH
10.0000 mL | Freq: Once | INTRAVENOUS | Status: AC
  Administered 2021-05-28: 10 mL via INTRAVENOUS
  Filled 2021-05-28: qty 10

## 2021-05-28 MED ORDER — SODIUM CHLORIDE 0.9 % IV SOLN
10.0000 mg | Freq: Once | INTRAVENOUS | Status: AC
  Administered 2021-05-28: 10 mg via INTRAVENOUS
  Filled 2021-05-28: qty 10

## 2021-05-28 MED ORDER — DIPHENHYDRAMINE HCL 50 MG/ML IJ SOLN
25.0000 mg | Freq: Once | INTRAMUSCULAR | Status: AC
  Administered 2021-05-28: 25 mg via INTRAVENOUS
  Filled 2021-05-28: qty 1

## 2021-05-28 MED ORDER — MAGIC MOUTHWASH W/LIDOCAINE: 5.0000 mL | Freq: Four times a day (QID) | ORAL | 1 refills | Status: AC | PRN

## 2021-05-28 NOTE — Progress Notes (Addendum)
[facility]  [phone] Fax:(336) [phone]  ID: Enedina Finner OB: [birth year]  MR#: [id number]  QPR#:[id number]  Patient Care Team: Josetta Huddle, MD as PCP - General (Internal Medicine) Garlan Fair, MD as Consulting Physician (Gastroenterology) Telford Nab, RN as Oncology Nurse Navigator [name], MD as Consulting Physician (Pulmonary Disease)  CHIEF COMPLAINT: Stage IVA adenocarcinoma of the lung with brain metastasis.  INTERVAL HISTORY: Patient returns to clinic today to assess his toleration of cycle 1 of carboplatinum, Taxol, and Avastin.  Has fatigue since tapering off dexamethasone. Otherwise tolerating treatment well. Some cough. Hasn't tried otcs. Mild mouth pain. He has no neurologic complaints. He denies any recent fevers or illnesses.  He has no chest pain, shortness of breath, cough, or hemoptysis.  He denies any nausea, vomiting, constipation, or diarrhea.  He has no urinary complaints.  Patient offers no further specific complaints today.  REVIEW OF SYSTEMS:   Review of Systems  Constitutional:  Positive for malaise/fatigue. Negative for chills, fever and weight loss.  HENT:  Negative for hearing loss, nosebleeds, sore throat and tinnitus.   Eyes: Negative.  Negative for blurred vision and double vision.  Respiratory: Negative.  Negative for cough, hemoptysis, shortness of breath and wheezing.   Cardiovascular:  Negative for chest pain, palpitations and leg swelling.  Gastrointestinal: Negative.  Negative for abdominal pain, blood in stool, constipation, diarrhea, melena, nausea and vomiting.  Genitourinary: Negative.  Negative for dysuria and urgency.  Musculoskeletal: Negative.  Negative for back pain, falls, joint pain and myalgias.  Skin: Negative.  Negative for itching and rash.  Neurological: Negative.  Negative for dizziness, tingling, sensory change, focal weakness, loss of consciousness, weakness and headaches.   Endo/Heme/Allergies:  Negative for environmental allergies. Does not bruise/bleed easily.  Psychiatric/Behavioral:  Negative for depression. The patient is nervous/anxious and has insomnia.   As per HPI. Otherwise, a complete review of systems is negative.  PAST MEDICAL HISTORY: Past Medical History:  Diagnosis Date   Anxiety    Aortic atherosclerosis ([place])    Aortic stenosis    s/p AVR in 05/2013   Arthritis    Bicuspid aortic valve    Bronchogenic carcinoma of right lung ([place])    MRI confirmed brain metastasis   CHF (congestive heart failure) (HCC)    COPD (chronic obstructive pulmonary disease) (HCC)    DDD (degenerative disc disease), lumbar    Dysplastic nevus 12/30/2020   R post waistline - moderate   Erectile dysfunction    GERD (gastroesophageal reflux disease)    Heart murmur    Hepatitis C    dx'd 1980s.  treated with Harvoni approx 2015   HLD (hyperlipidemia)    Homonymous hemianopia, left    Hypertension    Hypogonadism in male    Insomnia    Pericarditis    Spinal stenosis, lumbar 07/25/2019   severe   Squamous cell carcinoma of skin 12/18/2019   SCCIS L lat ankle   Squamous cell carcinoma of skin 11/07/2008   SCCIS L wrist   Thoracic aortic aneurysm [facility])    s/p aortic arch/hemi-arch repair in 05/2013    PAST SURGICAL HISTORY: Past Surgical History:  Procedure Laterality Date   AORTIC VALVE REPLACEMENT N/A 06/01/2013   Procedure: REPLACEMENT AORTIC VALVE; Surgeon: Gretchen Portela, MD; Location: [facility]PERATING ROOMS; Service: Cardiothoracic; Laterality: N/A; Bioprosthetic 25 mm [employer] trifecta bovine pericardial tissue valve   CATARACT EXTRACTION W/PHACO Right 12/17/2020   Procedure: CATARACT EXTRACTION PHACO AND INTRAOCULAR LENS  PLACEMENT (IOC) RIGHT 6.67 00:49.5;  Surgeon: Birder Robson, MD;  Location: [place];  Service: Ophthalmology;  Laterality: Right;   COLONOSCOPY WITH PROPOFOL N/A 01/04/2017   Procedure: COLONOSCOPY WITH  PROPOFOL;  Surgeon: Garlan Fair, MD;  Location: [facility];  Service: Endoscopy;  Laterality: N/A;   IR IMAGING GUIDED PORT INSERTION  04/11/2021   ORCHIECTOMY Left 2000   REPAIR THORACIC AORTA N/A 06/01/2013   Procedure: REPAIR THORACIC AORTIC ANEURYSM W/TRANSVERSE ARCH GRAFT - HemiArch; Surgeon: Gretchen Portela, MD; Location: [facility]; Service: Cardiothoracic; Laterality: N/A; #24 mm Dacron graft   VIDEO BRONCHOSCOPY WITH ENDOBRONCHIAL NAVIGATION N/A 04/18/2021   Procedure: VIDEO BRONCHOSCOPY WITH ENDOBRONCHIAL NAVIGATION;  Surgeon: [name], MD;  Location: [facility]S;  Service: Thoracic;  Laterality: N/A;   VIDEO BRONCHOSCOPY WITH ENDOBRONCHIAL ULTRASOUND N/A 04/18/2021   Procedure: VIDEO BRONCHOSCOPY WITH ENDOBRONCHIAL ULTRASOUND;  Surgeon: [name], MD;  Location: [facility] ORS;  Service: Thoracic;  Laterality: N/A;    FAMILY HISTORY: Family History  Problem Relation Age of Onset   Cancer Mother    Heart disease Father     ADVANCED DIRECTIVES (Y/N):  N  HEALTH MAINTENANCE: Social History   Tobacco Use   Smoking status: Every Day    Packs/day: 0.50    Pack years: 0.00    Types: Cigarettes    Start date: 11/16/1978   Smokeless tobacco: Never  Vaping Use   Vaping Use: Never used  Substance Use Topics   Alcohol use: Yes    Alcohol/week: 3.0 standard drinks    Types: 3 Standard drinks or equivalent per week    Comment: occasional   Drug use: No    Colonoscopy:  PAP:  Bone density:  Lipid panel:  Allergies  Allergen Reactions   Avelox [Moxifloxacin] Other (See Comments)    dizziness   Escitalopram Other (See Comments)   Lexapro [Escitalopram Oxalate] Other (See Comments)    Unsure of reaction   Rifampin Other (See Comments)    Unsure of reaction   Wellbutrin [Bupropion]     Anxiety/depression     Current Outpatient Medications  Medication Sig Dispense Refill   Alpha Lipoic Acid 200 MG CAPS Take 200 mg by mouth daily.      ALPRAZolam (XANAX) 0.5 MG tablet Take 0.5 mg by mouth daily as needed for anxiety.     Arginine (L-ARGININE-500) 500 MG CAPS Take 500 mg by mouth daily as needed (energy).     aspirin EC 81 MG tablet Take 81 mg by mouth in the morning. Swallow whole.     B Complex-C (B-COMPLEX WITH VITAMIN C) tablet Take 1 tablet by mouth daily.     Biotin 1000 MCG tablet Take 1,000 mcg by mouth daily.     Cholecalciferol (VITAMIN D3) 125 MCG (5000 UT) TABS Take 5,000 Units by mouth daily.     CINNAMON PO Take 6,000 mg by mouth in the morning. [place] Cinnamon     Coenzyme Q10 (COQ10) 100 MG CAPS Take 100 mg by mouth daily.     Cyanocobalamin (VITAMIN B-12) 3000 MCG SUBL Place 3,000 mcg under the tongue daily.     cyclobenzaprine (FLEXERIL) 10 MG tablet Take 10 mg by mouth 3 (three) times daily as needed for muscle spasms.     Dextran 70-Hypromellose, PF, (TEARS NATURALE FREE) 0.1-0.3 % SOLN Place 1-2 drops into both eyes 3 (three) times daily as needed (dry/irritated eyes.).     dicyclomine (BENTYL) 20 MG tablet Take 20-40 mg by mouth  daily as needed for spasms.     diphenhydrAMINE HCl (ZZZQUIL) 50 MG/30ML LIQD Take 15-30 mLs by mouth at bedtime as needed (sleep).     dronabinol (MARINOL) 5 MG capsule Take by mouth.     Ginkgo Biloba 120 MG TABS Take 120 mg by mouth in the morning.     guaifenesin (HUMIBID E) 400 MG TABS tablet Take 400 mg by mouth 2 (two) times daily as needed (congestion/allergies).     hydrocortisone (ANUSOL-HC) 2.5 % rectal cream Apply topically 2 (two) times daily as needed for hemorrhoids or anal itching.     lidocaine-prilocaine (EMLA) cream Apply to affected area once 30 g 3   loratadine (CLARITIN) 10 MG tablet Take 10 mg by mouth daily.     losartan (COZAAR) 100 MG tablet Take 100 mg by mouth in the morning.     Lutein-Zeaxanthin 20-1 MG CAPS Take 1 capsule by mouth in the morning.     magic mouthwash w/lidocaine SOLN Take 5 mLs by mouth 4 (four) times daily as needed for mouth pain.  360 mL 1   melatonin 5 MG TABS Take 5 mg by mouth at bedtime as needed (sleep).     metoprolol (LOPRESSOR) 50 MG tablet Take 50 mg by mouth 2 (two) times daily.     milk thistle 175 MG tablet Take 175 mg by mouth in the morning.     Multiple Vitamin (MULTIVITAMIN WITH MINERALS) TABS tablet Take 1 tablet by mouth daily. Men's 50+ Multivitamin     mupirocin ointment (BACTROBAN) 2 % APPLY TO AFFECTED AREA EVERY DAY 22 g 4   Naphazoline-Pheniramine (OPCON-A) 0.027-0.315 % SOLN Place 1 drop into both eyes 3 (three) times daily as needed (dry/irritated eyes.).     naproxen sodium (ALEVE) 220 MG tablet Take 220 mg by mouth daily as needed (pain).     Omega-3 Fatty Acids (FISH OIL) 1200 MG CAPS Take 1,200 mg by mouth daily.     omeprazole (PRILOSEC) 20 MG capsule Take 20 mg by mouth in the morning.     oxyCODONE (OXY IR/ROXICODONE) 5 MG immediate release tablet Take 10 mg by mouth 2 (two) times daily as needed for severe pain (spinal stenosis pain.).     Saw Palmetto 450 MG CAPS Take 450 mg by mouth daily.     Selenium (SELENIMIN-200 PO) Take 200 mg by mouth daily.     simethicone (MYLICON) 350 MG chewable tablet Chew 125 mg by mouth in the morning.     simvastatin (ZOCOR) 20 MG tablet Take 20 mg by mouth daily before supper.     Testosterone 20.25 MG/ACT (1.62%) GEL APPLY 4 PUMPS DAILY     Testosterone 40.5 MG/2.5GM (1.62%) GEL Place 4 Pump onto the skin in the morning. Applied to shoulders     traZODone (DESYREL) 50 MG tablet Take 50 mg by mouth at bedtime as needed for sleep.     triamcinolone (NASACORT) 55 MCG/ACT AERO nasal inhaler Place 2 sprays into the nose daily.     umeclidinium-vilanterol (ANORO ELLIPTA) 62.5-25 MCG/INH AEPB Inhale 1 puff into the lungs daily as needed (respiratory issues.).     vitamin C (ASCORBIC ACID) 500 MG tablet Take 500 mg by mouth in the morning.     vitamin E 180 MG (400 UNITS) capsule Take 400 Units by mouth daily.     VOLTAREN 1 % GEL Apply 2 g topically 4 (four)  times daily as needed (pain).  3   Zinc 50 MG TABS  Take 50 mg by mouth daily.     zolpidem (AMBIEN) 10 MG tablet Take 10 mg by mouth at bedtime as needed for sleep.     ondansetron (ZOFRAN) 8 MG tablet Take 1 tablet (8 mg total) by mouth 2 (two) times daily as needed for refractory nausea / vomiting. Start on day 3 after carboplatin chemo. (Patient not taking: Reported on 05/28/2021) 60 tablet 1   pregabalin (LYRICA) 50 MG capsule Take 50 mg by mouth daily as needed (feet neuropathy). (Patient not taking: Reported on 05/28/2021)     prochlorperazine (COMPAZINE) 10 MG tablet Take 1 tablet (10 mg total) by mouth every 6 (six) hours as needed (Nausea or vomiting). (Patient not taking: Reported on 05/28/2021) 60 tablet 1   TURMERIC CURCUMIN PO Take 1,500 mg by mouth daily. (Patient not taking: Reported on 05/28/2021)     varenicline (CHANTIX STARTING MONTH PAK) 0.5 MG X 11 & 1 MG X 42 tablet Take one 0.5 mg tablet by mouth once daily for 3 days, then increase to one 0.5 mg tablet twice daily for 4 days, then increase to one 1 mg tablet twice daily. (Patient not taking: Reported on 05/28/2021) 53 tablet 0   No current facility-administered medications for this visit.    OBJECTIVE: Vitals:   05/28/21 0855  BP: 115/83  Pulse: 86  Resp: 18  Temp: 98.9 F (37.2 C)  SpO2: 97%     Body mass index is 24.94 kg/m.    ECOG FS:0 - Asymptomatic  General: Well-developed, well-nourished, no acute distress. HEENT: mild spots of erythema. No ulcerations Eyes: Pink conjunctiva, anicteric sclera. Lungs: Clear to auscultation bilaterally.  No audible wheezing or coughing Heart: Regular rate and rhythm.  Abdomen: Soft, nontender, nondistended.  Musculoskeletal: No edema, cyanosis, or clubbing. Neuro: Alert, answering all questions appropriately. Cranial nerves grossly intact. Skin: No rashes or petechiae noted. Psych: Normal affect.  LAB RESULTS:  Lab Results  Component Value Date   NA 135 05/28/2021   K  4.2 05/28/2021   CL 104 05/28/2021   CO2 23 05/28/2021   GLUCOSE 126 (H) 05/28/2021   BUN 11 05/28/2021   CREATININE 0.61 05/28/2021   CALCIUM 8.4 (L) 05/28/2021   PROT 7.0 05/28/2021   ALBUMIN 3.6 05/28/2021   AST 28 05/28/2021   ALT 28 05/28/2021   ALKPHOS 83 05/28/2021   BILITOT 0.8 05/28/2021   GFRNONAA >60 05/28/2021   GFRAA >89 08/02/2014    Lab Results  Component Value Date   WBC 13.7 (H) 05/28/2021   NEUTROABS 6.8 05/28/2021   HGB 12.6 (L) 05/28/2021   HCT 37.6 (L) 05/28/2021   MCV 96.9 05/28/2021   PLT 321 05/28/2021     STUDIES: No results found.   ASSESSMENT: Stage IVA adenocarcinoma of the lung with brain metastasis.  PLAN:    1. Stage IVA adenocarcinoma of the lung with brain metastasis: MRI of the brain from Mar 27, 2021 revealed 16 lesions consistent with metastatic disease.  Subsequent PET scan on Mar 31, 2021 revealed a hypermetabolic pulmonary nodule in the right apex along with a large right suprahilar nodal mass.  Biopsy confirmed adenocarcinoma of lung origin.  Molecular studies do not appear to have any actionable mutations and PD-L1 is less than 1%.  He is currently s/p cycle 1 of palliative carboplatinum, taxol, avastin with fulphilia support every 3 weeks. Plan for at least 4-6 cycles. Labs today reviewed and acceptable for continuation of treatment. RTC in 2 days for fulphilia support. RTC  in 3 weeks for labs, MD, consideration of cycle 3 of carbo-taxol-avastin.   2.  Brain metastasis: Patient completed XRT on May 14, 2021.  3. Peripheral edema/insomnia: Improving. Patient has been instructed to dose reduce his Decadron to 2 mg once per day for 7 days and then discontinue.  4.  Leukocytosis: Likely secondary to steroids and fulphilia. Monitor.   5.  Thrombocytopenia: Secondary to chemotherapy, monitor.  6. Weight loss- referred to dietician  7. Mouth sores- chemo related. Mild. Start magic mouthwash  8. Fatigue- chemo and weight loss  related. Monitor.   9. Anemia- chemo related. Monitor.   10. Cough- suspect malignancy related vs treatment effect. Can try otc dextromethorphan. On mucinex. If refractory, can try tussionex or similar.    Patient expressed understanding and was in agreement with this plan. He also understands that He can call clinic at any time with any questions, concerns, or complaints.   Cancer Staging Adenocarcinoma of right lung [facility]) Staging form: Lung, AJCC 8th Edition - Clinical stage from 04/30/2021: Stage IVA (cT2a, cN2, cM1b) - Signed by Lloyd Huger, MD on 04/30/2021  Verlon Au, NP   05/28/2021 9:52 AM

## 2021-05-28 NOTE — Progress Notes (Signed)
Reports low appetite. Especially since stopping dexamethasone. Instructed pt to drink 3-4 ensure per day. Has a nagging cough; he thinks he might have bronchitis. Wakes up every morning with nagging headaches. Fatigue is his biggest side effect.

## 2021-05-28 NOTE — Progress Notes (Signed)
Met with patient during follow up visit prior to chemotherapy treatment. All questions answered during visit. Pt stated that he is interested in seeing dietitian as well as rehab therapist. Referrals placed for scheduling. Nothing further needed at this time. Instructed pt to call with any further questions or needs. Pt verbalized understanding.

## 2021-05-28 NOTE — Patient Instructions (Signed)
[facility]  Discharge Instructions: Thank you for choosing [facility] to provide your oncology and hematology care.  If you have a lab appointment with the [facility], please go directly to the [facility] and check in at the registration area.  Wear comfortable clothing and clothing appropriate for easy access to any Portacath or PICC line.   We strive to give you quality time with your provider. You may need to reschedule your appointment if you arrive late (15 or more minutes).  Arriving late affects you and other patients whose appointments are after yours.  Also, if you miss three or more appointments without notifying the office, you may be dismissed from the clinic at the provider's discretion.      For prescription refill requests, have your pharmacy contact our office and allow 72 hours for refills to be completed.    Today you received the following chemotherapy and/or immunotherapy agents : taxol / Carboplatin   To help prevent nausea and vomiting after your treatment, we encourage you to take your nausea medication as directed.  BELOW ARE SYMPTOMS THAT SHOULD BE REPORTED IMMEDIATELY: *FEVER GREATER THAN 100.4 F (38 C) OR HIGHER *CHILLS OR SWEATING *NAUSEA AND VOMITING THAT IS NOT CONTROLLED WITH YOUR NAUSEA MEDICATION *UNUSUAL SHORTNESS OF BREATH *UNUSUAL BRUISING OR BLEEDING *URINARY PROBLEMS (pain or burning when urinating, or frequent urination) *BOWEL PROBLEMS (unusual diarrhea, constipation, pain near the anus) TENDERNESS IN MOUTH AND THROAT WITH OR WITHOUT PRESENCE OF ULCERS (sore throat, sores in mouth, or a toothache) UNUSUAL RASH, SWELLING OR PAIN  UNUSUAL VAGINAL DISCHARGE OR ITCHING   Items with * indicate a potential emergency and should be followed up as soon as possible or go to the Emergency Department if any problems should occur.  Please show the CHEMOTHERAPY ALERT CARD or IMMUNOTHERAPY ALERT CARD at  check-in to the Emergency Department and triage nurse.  Should you have questions after your visit or need to cancel or reschedule your appointment, please contact [facility]  [phone] and follow the prompts.  Office hours are 8:00 a.m. to 4:30 p.m. Monday - Friday. Please note that voicemails left after 4:00 p.m. may not be returned until the following business day.  We are closed weekends and major holidays. You have access to a nurse at all times for urgent questions. Please call the main number to the clinic [phone] and follow the prompts.  For any non-urgent questions, you may also contact your provider using MyChart. We now offer e-Visits for anyone 10 and older to request care online for non-urgent symptoms. For details visit [email or web address].   Also download the MyChart app! Go to the app store, search "MyChart", open the app, select [facility], and log in with your MyChart username and password.  Due to Covid, a mask is required upon entering the hospital/clinic. If you do not have a mask, one will be given to you upon arrival. For doctor visits, patients may have 1 support person aged 70 or older with them. For treatment visits, patients cannot have anyone with them due to current Covid guidelines and our immunocompromised population.

## 2021-05-28 NOTE — Progress Notes (Signed)
Reviewed anti-emetic regimen for at home with pt in case he needs. Advised per Beckey Rutter NP to take Robitussin or Delsym 12 hr prn cough. Call if worsens. Pt completed tx without incident.

## 2021-05-30 ENCOUNTER — Inpatient Hospital Stay: Payer: Medicare Other

## 2021-05-30 ENCOUNTER — Other Ambulatory Visit: Payer: Self-pay

## 2021-05-30 DIAGNOSIS — C7931 Secondary malignant neoplasm of brain: Secondary | ICD-10-CM | POA: Diagnosis not present

## 2021-05-30 DIAGNOSIS — Z5111 Encounter for antineoplastic chemotherapy: Secondary | ICD-10-CM | POA: Diagnosis not present

## 2021-05-30 DIAGNOSIS — D6959 Other secondary thrombocytopenia: Secondary | ICD-10-CM | POA: Diagnosis not present

## 2021-05-30 DIAGNOSIS — R059 Cough, unspecified: Secondary | ICD-10-CM | POA: Diagnosis not present

## 2021-05-30 DIAGNOSIS — F17210 Nicotine dependence, cigarettes, uncomplicated: Secondary | ICD-10-CM | POA: Diagnosis not present

## 2021-05-30 DIAGNOSIS — D6481 Anemia due to antineoplastic chemotherapy: Secondary | ICD-10-CM | POA: Diagnosis not present

## 2021-05-30 DIAGNOSIS — Z7982 Long term (current) use of aspirin: Secondary | ICD-10-CM | POA: Diagnosis not present

## 2021-05-30 DIAGNOSIS — D72829 Elevated white blood cell count, unspecified: Secondary | ICD-10-CM | POA: Diagnosis not present

## 2021-05-30 DIAGNOSIS — C3491 Malignant neoplasm of unspecified part of right bronchus or lung: Secondary | ICD-10-CM

## 2021-05-30 DIAGNOSIS — Z79899 Other long term (current) drug therapy: Secondary | ICD-10-CM | POA: Diagnosis not present

## 2021-05-30 DIAGNOSIS — Z5112 Encounter for antineoplastic immunotherapy: Secondary | ICD-10-CM | POA: Diagnosis not present

## 2021-05-30 DIAGNOSIS — T451X5A Adverse effect of antineoplastic and immunosuppressive drugs, initial encounter: Secondary | ICD-10-CM | POA: Diagnosis not present

## 2021-05-30 DIAGNOSIS — Z5189 Encounter for other specified aftercare: Secondary | ICD-10-CM | POA: Diagnosis not present

## 2021-05-30 DIAGNOSIS — J069 Acute upper respiratory infection, unspecified: Secondary | ICD-10-CM | POA: Diagnosis not present

## 2021-05-30 MED ORDER — PEGFILGRASTIM-CBQV 6 MG/0.6ML ~~LOC~~ SOSY
6.0000 mg | PREFILLED_SYRINGE | Freq: Once | SUBCUTANEOUS | Status: AC
  Administered 2021-05-30: 6 mg via SUBCUTANEOUS
  Filled 2021-05-30: qty 0.6

## 2021-06-06 ENCOUNTER — Inpatient Hospital Stay (HOSPITAL_BASED_OUTPATIENT_CLINIC_OR_DEPARTMENT_OTHER): Payer: Medicare Other | Admitting: Hospice and Palliative Medicine

## 2021-06-06 ENCOUNTER — Telehealth: Payer: Self-pay | Admitting: *Deleted

## 2021-06-06 DIAGNOSIS — J069 Acute upper respiratory infection, unspecified: Secondary | ICD-10-CM

## 2021-06-06 MED ORDER — HYDROCOD POLST-CPM POLST ER 10-8 MG/5ML PO SUER: 5.0000 mL | Freq: Two times a day (BID) | ORAL | 0 refills | Status: DC | PRN

## 2021-06-06 MED ORDER — AMOXICILLIN-POT CLAVULANATE 875-125 MG PO TABS: 1.0000 | ORAL_TABLET | Freq: Two times a day (BID) | ORAL | 0 refills | Status: DC

## 2021-06-06 NOTE — Telephone Encounter (Signed)
Pt's wife called in to report pt is experiencing shortness of breath, cough, low grade fever, and decreased appetite. They would like to know recommendations or if he needs to be seen to help manage his symptoms.  Please advise.

## 2021-06-06 NOTE — Progress Notes (Signed)
Virtual Visit via Telephone Note  I connected with Matthew Kelly on 06/06/21 at  2:00 PM EDT by telephone and verified that I am speaking with the correct person using two identifiers.  Location: Patient: Home Provider: Clinic   I discussed the limitations, risks, security and privacy concerns of performing an evaluation and management service by telephone and the availability of in person appointments. I also discussed with the patient that there may be a patient responsible charge related to this service. The patient expressed understanding and agreed to proceed.   History of Present Illness: Mr. Matthew Kelly is a 65 year old male with multiple medical problems including stage IV adenocarcinoma of the lung with brain metastasis who is on treatment with carboplatin/Taxol/Avastin.  Patient was added on to [facility] today for complaint of productive cough, pulmonary congestion, nasal congestion, and sore throat.  He denies fever or chills.  Denies shortness of breath or wheezing.  He has very little appetite.  He did note his blood pressure to be running low yesterday but is normal when he checked it today.  His SaO2 has been normal.  Patient denies other significant symptoms.   Observations/Objective: I spoke with patient and wife by phone.  He has been trying Delsym without significant provement in his cough.  He has taken a rapid COVID test twice with negative results.  Patient reports that symptoms have persisted for 5 days.  Symptoms are not inherently worsening but also did not seem to be getting better.  Assessment and Plan: URI -symptoms seem consistent with URI.  Patient is at high risk for infection as he is on active chemotherapy.  We will start him empirically on Augmentin.  Will prescribe Tussionex for cough.  Patient would like to restart dexamethasone for appetite/fatigue.  Suggested that he limit to 4 mg daily x 5-7 days.  Recommended increased oral fluids and monitoring BP and SaO2.   Discussed triggers including decline or worsening symptoms that would necessitate ER or urgent care evaluation over the weekend.  Also suggested that we could see him early next week in [facility] and check labs if needed.  Case and plan discussed with Dr. Grayland Ormond  Follow Up Instructions: As needed   I discussed the assessment and treatment plan with the patient. The patient was provided an opportunity to ask questions and all were answered. The patient agreed with the plan and demonstrated an understanding of the instructions.   The patient was advised to call back or seek an in-person evaluation if the symptoms worsen or if the condition fails to improve as anticipated.  I provided 25 minutes of non-face-to-face time during this encounter.   Irean Hong, NP

## 2021-06-06 NOTE — Telephone Encounter (Signed)
Spoke with pt's wife to inform pt needs to take covid test today and have virtual visit with [name] in [facility] today at 2pm. Pt's wife verbalized understanding.

## 2021-06-06 NOTE — Telephone Encounter (Signed)
Forward message.

## 2021-06-06 NOTE — Telephone Encounter (Signed)
Matthew Kelly was already on the appt list for him and after Rogue Bussing spoke to  pt and then [name] saw the patient and took care of needs.

## 2021-06-06 NOTE — Telephone Encounter (Signed)
Patient reports that he is experiencing many side effects since receiving his injection last Friday with no improvement of the week. He reports no appetite, constant cough and increasing hoarseness.

## 2021-06-09 ENCOUNTER — Encounter: Payer: Self-pay | Admitting: Radiation Oncology

## 2021-06-09 ENCOUNTER — Ambulatory Visit
Admission: RE | Admit: 2021-06-09 | Discharge: 2021-06-09 | Disposition: A | Discharge status: Home / Self Care | Payer: Medicare Other | Source: Ambulatory Visit | Attending: Radiation Oncology | Admitting: Radiation Oncology

## 2021-06-09 ENCOUNTER — Other Ambulatory Visit: Payer: Self-pay | Admitting: *Deleted

## 2021-06-09 ENCOUNTER — Other Ambulatory Visit: Payer: Self-pay | Admitting: Hospice and Palliative Medicine

## 2021-06-09 VITALS — BP 115/72 | HR 84 | Temp 97.2°F | Resp 20 | Wt 198.3 lb

## 2021-06-09 DIAGNOSIS — C7931 Secondary malignant neoplasm of brain: Secondary | ICD-10-CM | POA: Insufficient documentation

## 2021-06-09 DIAGNOSIS — C3491 Malignant neoplasm of unspecified part of right bronchus or lung: Secondary | ICD-10-CM | POA: Diagnosis not present

## 2021-06-09 DIAGNOSIS — R232 Flushing: Secondary | ICD-10-CM | POA: Insufficient documentation

## 2021-06-09 DIAGNOSIS — R058 Other specified cough: Secondary | ICD-10-CM | POA: Diagnosis not present

## 2021-06-09 MED ORDER — HYDROCOD POLST-CPM POLST ER 10-8 MG/5ML PO SUER: 5.0000 mL | Freq: Two times a day (BID) | ORAL | 0 refills | Status: AC | PRN

## 2021-06-09 NOTE — Progress Notes (Signed)
Patient seen this AM by Dr. Baruch Gouty. Reportedly, Tussionex is helping. Patient requested refill.

## 2021-06-09 NOTE — Progress Notes (Signed)
Radiation Oncology Follow up Note  Name: Matthew Kelly   Date:   06/09/2021 MRN:  [id number] DOB: 1956-03-14    This 65 y.o. male presents to the clinic today for 1 month follow-up status post whole brain radiation therapy for stage IV.  Adenocarcinoma the right lung  REFERRING PROVIDER: Josetta Huddle, MD  HPI: Patient is a 65 year old male now at 1 month having completed whole brain radiation therapy for palliation in patient with known stage IV adenocarcinoma of the right lung.  From neurologic standpoint he is doing well his vision is improved still has some flashes of light occasionally.  He is currently undergoing. carboplatinum, Taxol, and Avastin with Neulasta support every 3 weeks for at least 4-6 cycles.  He has a productive cough currently on antibiotic therapy.  No hemoptysis.  COMPLICATIONS OF TREATMENT: none  FOLLOW UP COMPLIANCE: keeps appointments   PHYSICAL EXAM:  BP 115/72   Pulse 84   Temp (!) 97.2 F (36.2 C) (Tympanic)   Resp 20   Wt 198 lb 4.8 oz (89.9 kg)   BMI 24.14 kg/m  Well-developed well-nourished patient in NAD. HEENT reveals PERLA, EOMI, discs not visualized.  Oral cavity is clear. No oral mucosal lesions are identified. Neck is clear without evidence of cervical or supraclavicular adenopathy. Lungs are clear to A&P. Cardiac examination is essentially unremarkable with regular rate and rhythm without murmur rub or thrill. Abdomen is benign with no organomegaly or masses noted. Motor sensory and DTR levels are equal and symmetric in the upper and lower extremities. Cranial nerves II through XII are grossly intact. Proprioception is intact. No peripheral adenopathy or edema is identified. No motor or sensory levels are noted. Crude visual fields are within normal range.  RADIOLOGY RESULTS: Previous CT scan reviewed  PLAN: At this time I reached out to Dr. Grayland Ormond to offer possible short 10 fraction course of palliative radiation therapy to his right lung.  My  aim would be to reduce cough hemoptysis and further atelectasis of the lung.  Depending on [facility] opinion will either get the patient back for simulation or just follow on his as needed basis.  Patient and wife both comprehend my recommendations well.  I would like to take this opportunity to thank you for allowing me to participate in the care of your patient.Noreene Filbert, MD

## 2021-06-10 ENCOUNTER — Telehealth: Payer: Self-pay | Admitting: *Deleted

## 2021-06-10 NOTE — Telephone Encounter (Signed)
Patient called reporting that he was to have refill sent to [facility] for his cough medicine and that he was told it was not received. I see in his chart that it was sent and so I called [facility] and spoke with pharmacy manager who said that they got prescription, but it is too early to fill it as he got a 14 day supply 06/06/21. I called and spoke with patient who said that his wife has been preparing his dose every 12 hours in a cup filled to the top line, the cup has 3 lines on it, a 5 a 10 mark and something else and said he thought she was giving him 5 ml. I pointed out that he said the cup was filled to top line so it was more than 5 ml if that was true. We discussed that I would check with Sharion Dove, NP to see if he would approve early refill and that his insurance may not cover it itr he does and that he needs to pay closer attention to the dose he is taking. I then spoke with Sharion Dove, NP who approved early fill and asked that when I spoke with pharmacy to ask them to go over dosing in detail with patient/wife. I called and spoke with pharmacist who agreed to go ahead and refill medicine and she stated that wife was instructed on dosing with first prescription and given a syringe to measure dose, bit she would reiterate again to her the importance of correct dosing and be sure to mark a syringe for her again

## 2021-06-11 ENCOUNTER — Ambulatory Visit
Admission: RE | Admit: 2021-06-11 | Discharge: 2021-06-11 | Disposition: A | Discharge status: Home / Self Care | Payer: Medicare Other | Source: Ambulatory Visit | Attending: Radiation Oncology | Admitting: Radiation Oncology

## 2021-06-11 ENCOUNTER — Inpatient Hospital Stay: Payer: Medicare Other | Admitting: Occupational Therapy

## 2021-06-11 DIAGNOSIS — R918 Other nonspecific abnormal finding of lung field: Secondary | ICD-10-CM | POA: Diagnosis not present

## 2021-06-11 DIAGNOSIS — C7931 Secondary malignant neoplasm of brain: Secondary | ICD-10-CM | POA: Diagnosis not present

## 2021-06-11 DIAGNOSIS — C3402 Malignant neoplasm of left main bronchus: Secondary | ICD-10-CM | POA: Diagnosis not present

## 2021-06-13 DIAGNOSIS — C7931 Secondary malignant neoplasm of brain: Secondary | ICD-10-CM | POA: Diagnosis not present

## 2021-06-13 DIAGNOSIS — C3402 Malignant neoplasm of left main bronchus: Secondary | ICD-10-CM | POA: Diagnosis not present

## 2021-06-13 DIAGNOSIS — R918 Other nonspecific abnormal finding of lung field: Secondary | ICD-10-CM | POA: Diagnosis not present

## 2021-06-15 NOTE — Progress Notes (Signed)
[place]  Telephone:([phone]) [phone] Fax:(336) [phone]  ID: Matthew Kelly OB: 02/15/1956  MR#: [id number]  GTX#:[id number]  Patient Care Team: Josetta Huddle, MD as PCP - General (Internal Medicine) Garlan Fair, MD as Consulting Physician (Gastroenterology) Telford Nab, RN as Oncology Nurse Navigator [name], MD as Consulting Physician (Pulmonary Disease)  CHIEF COMPLAINT: Stage IVA adenocarcinoma of the lung with brain metastasis.  INTERVAL HISTORY: Patient returns to clinic today for further evaluation and consideration of cycle 3 of carboplatinum, Taxol, and Zirabev.  His fevers, cough, congestion resolved with antibiotics, but he continues to have residual hoarseness of voice.  He has a mild peripheral neuropathy in his fingertips and toes that do not affect his day-to-day activity.  He otherwise feels well and is tolerating his treatments.  He continues to be anxious.  He has no other neurologic complaints.  He has no chest pain, shortness of breath, cough, or hemoptysis.  He denies any nausea, vomiting, constipation, or diarrhea.  He has no urinary complaints.  Patient offers no further specific complaints today.  REVIEW OF SYSTEMS:   Review of Systems  Constitutional: Negative.  Negative for fever, malaise/fatigue and weight loss.  Eyes: Negative.  Negative for blurred vision.  Respiratory: Negative.  Negative for cough, hemoptysis and shortness of breath.   Cardiovascular:  Negative for chest pain and leg swelling.  Gastrointestinal: Negative.  Negative for abdominal pain.  Genitourinary: Negative.  Negative for dysuria.  Musculoskeletal: Negative.  Negative for back pain.  Skin: Negative.  Negative for rash.  Neurological:  Positive for tingling and sensory change. Negative for focal weakness, weakness and headaches.  Psychiatric/Behavioral:  The patient is nervous/anxious. The patient does not have insomnia.    As per HPI. Otherwise, a  complete review of systems is negative.  PAST MEDICAL HISTORY: Past Medical History:  Diagnosis Date   Anxiety    Aortic atherosclerosis ([place])    Aortic stenosis    s/p AVR in 05/2013   Arthritis    Bicuspid aortic valve    Bronchogenic carcinoma of right lung ([place])    MRI confirmed brain metastasis   CHF (congestive heart failure) ([facility])    COPD (chronic obstructive pulmonary disease) ([facility])    DDD (degenerative disc disease), lumbar    Dysplastic nevus 12/30/2020   R post waistline - moderate   Erectile dysfunction    GERD (gastroesophageal reflux disease)    Heart murmur    Hepatitis C    dx'd 1980s.  treated with Harvoni approx 2015   HLD (hyperlipidemia)    Homonymous hemianopia, left    Hypertension    Hypogonadism in male    Insomnia    Pericarditis    Spinal stenosis, lumbar 07/25/2019   severe   Squamous cell carcinoma of skin 12/18/2019   SCCIS L lat ankle   Squamous cell carcinoma of skin 11/07/2008   SCCIS L wrist   Thoracic aortic aneurysm [facility])    s/p aortic arch/hemi-arch repair in 05/2013    PAST SURGICAL HISTORY: Past Surgical History:  Procedure Laterality Date   AORTIC VALVE REPLACEMENT N/A 06/01/2013   Procedure: REPLACEMENT AORTIC VALVE; Surgeon: Gretchen Portela, MD; Location: [facility]; Service: Cardiothoracic; Laterality: N/A; Bioprosthetic 25 mm St. Jude trifecta bovine pericardial tissue valve   CATARACT EXTRACTION W/PHACO Right 12/17/2020   Procedure: CATARACT EXTRACTION PHACO AND INTRAOCULAR LENS PLACEMENT ([place]) RIGHT 6.67 00:49.5;  Surgeon: Birder Robson, MD;  Location: [facility];  Service: Ophthalmology;  Laterality: Right;  COLONOSCOPY WITH PROPOFOL N/A 01/04/2017   Procedure: COLONOSCOPY WITH PROPOFOL;  Surgeon: Garlan Fair, MD;  [facility];  Service: Endoscopy;  Laterality: N/A;   IR IMAGING GUIDED PORT INSERTION  04/11/2021   ORCHIECTOMY Left 2000   REPAIR THORACIC AORTA N/A 06/01/2013    Procedure: REPAIR THORACIC AORTIC ANEURYSM W/TRANSVERSE ARCH GRAFT - HemiArch; Surgeon: Gretchen Portela, MD; Location: [facility]; Service: Cardiothoracic; Laterality: N/A; #24 mm Dacron graft   VIDEO BRONCHOSCOPY WITH ENDOBRONCHIAL NAVIGATION N/A 04/18/2021   Procedure: VIDEO BRONCHOSCOPY WITH ENDOBRONCHIAL NAVIGATION;  Surgeon: [name], MD;  Location: [facility];  Service: Thoracic;  Laterality: N/A;   VIDEO BRONCHOSCOPY WITH ENDOBRONCHIAL ULTRASOUND N/A 04/18/2021   Procedure: VIDEO BRONCHOSCOPY WITH ENDOBRONCHIAL ULTRASOUND;  Surgeon: [name], MD;  Location: [facility];  Service: Thoracic;  Laterality: N/A;    FAMILY HISTORY: Family History  Problem Relation Age of Onset   Cancer Mother    Heart disease Father     ADVANCED DIRECTIVES (Y/N):  N  HEALTH MAINTENANCE: Social History   Tobacco Use   Smoking status: Every Day    Packs/day: 0.50    Types: Cigarettes    Start date: 11/16/1978   Smokeless tobacco: Never  Vaping Use   Vaping Use: Never used  Substance Use Topics   Alcohol use: Yes    Alcohol/week: 3.0 standard drinks    Types: 3 Standard drinks or equivalent per week    Comment: occasional   Drug use: No     Colonoscopy:  PAP:  Bone density:  Lipid panel:  Allergies  Allergen Reactions   Avelox [Moxifloxacin] Other (See Comments)    dizziness   Escitalopram Other (See Comments)   Lexapro [Escitalopram Oxalate] Other (See Comments)    Unsure of reaction   Rifampin Other (See Comments)    Unsure of reaction   Wellbutrin [Bupropion]     Anxiety/depression     Current Outpatient Medications  Medication Sig Dispense Refill   Alpha Lipoic Acid 200 MG CAPS Take 200 mg by mouth daily.     ALPRAZolam (XANAX) 0.5 MG tablet Take 0.5 mg by mouth daily as needed for anxiety.     Arginine (L-ARGININE-500) 500 MG CAPS Take 500 mg by mouth daily as needed (energy).     aspirin EC 81 MG tablet Take 81 mg by mouth in the morning. Swallow  whole.     B Complex-C (B-COMPLEX WITH VITAMIN C) tablet Take 1 tablet by mouth daily.     Biotin 1000 MCG tablet Take 1,000 mcg by mouth daily.     chlorpheniramine-HYDROcodone (TUSSIONEX) 10-8 MG/5ML SUER Take 5 mLs by mouth every 12 (twelve) hours as needed for cough. 473 mL 0   Cholecalciferol (VITAMIN D3) 125 MCG (5000 UT) TABS Take 5,000 Units by mouth daily.     CINNAMON PO Take 6,000 mg by mouth in the morning. [place] Cinnamon     Coenzyme Q10 (COQ10) 100 MG CAPS Take 100 mg by mouth daily.     Cyanocobalamin (VITAMIN B-12) 3000 MCG SUBL Place 3,000 mcg under the tongue daily.     cyclobenzaprine (FLEXERIL) 10 MG tablet Take 10 mg by mouth 3 (three) times daily as needed for muscle spasms.     dexamethasone (DECADRON) 4 MG tablet Take 4 mg by mouth daily.     Dextran 70-Hypromellose, PF, (TEARS NATURALE FREE) 0.1-0.3 % SOLN Place 1-2 drops into both eyes 3 (three) times daily as needed (dry/irritated eyes.).  dicyclomine (BENTYL) 20 MG tablet Take 20-40 mg by mouth daily as needed for spasms.     diphenhydrAMINE HCl (ZZZQUIL) 50 MG/30ML LIQD Take 15-30 mLs by mouth at bedtime as needed (sleep).     Ginkgo Biloba 120 MG TABS Take 120 mg by mouth in the morning.     guaifenesin (HUMIBID E) 400 MG TABS tablet Take 400 mg by mouth 2 (two) times daily as needed (congestion/allergies).     hydrocortisone (ANUSOL-HC) 2.5 % rectal cream Apply topically 2 (two) times daily as needed for hemorrhoids or anal itching.     lidocaine-prilocaine (EMLA) cream Apply to affected area once 30 g 3   loratadine (CLARITIN) 10 MG tablet Take 10 mg by mouth daily.     losartan (COZAAR) 100 MG tablet Take 100 mg by mouth in the morning.     Lutein-Zeaxanthin 20-1 MG CAPS Take 1 capsule by mouth in the morning.     magic mouthwash w/lidocaine SOLN Take 5 mLs by mouth 4 (four) times daily as needed for mouth pain. 360 mL 1   melatonin 5 MG TABS Take 5 mg by mouth at bedtime as needed (sleep).     metoprolol  (LOPRESSOR) 50 MG tablet Take 50 mg by mouth 2 (two) times daily.     milk thistle 175 MG tablet Take 175 mg by mouth in the morning.     Multiple Vitamin (MULTIVITAMIN WITH MINERALS) TABS tablet Take 1 tablet by mouth daily. Men's 50+ Multivitamin     mupirocin ointment (BACTROBAN) 2 % APPLY TO AFFECTED AREA EVERY DAY 22 g 4   Naphazoline-Pheniramine (OPCON-A) 0.027-0.315 % SOLN Place 1 drop into both eyes 3 (three) times daily as needed (dry/irritated eyes.).     naproxen sodium (ALEVE) 220 MG tablet Take 220 mg by mouth daily as needed (pain).     Omega-3 Fatty Acids (FISH OIL) 1200 MG CAPS Take 1,200 mg by mouth daily.     omeprazole (PRILOSEC) 20 MG capsule Take 20 mg by mouth in the morning.     ondansetron (ZOFRAN) 8 MG tablet Take 1 tablet (8 mg total) by mouth 2 (two) times daily as needed for refractory nausea / vomiting. Start on day 3 after carboplatin chemo. 60 tablet 1   oxyCODONE (OXY IR/ROXICODONE) 5 MG immediate release tablet Take 10 mg by mouth 2 (two) times daily as needed for severe pain (spinal stenosis pain.).     pregabalin (LYRICA) 50 MG capsule Take 50 mg by mouth daily as needed (feet neuropathy).     prochlorperazine (COMPAZINE) 10 MG tablet Take 1 tablet (10 mg total) by mouth every 6 (six) hours as needed (Nausea or vomiting). 60 tablet 1   Saw Palmetto 450 MG CAPS Take 450 mg by mouth daily.     Selenium (SELENIMIN-200 PO) Take 200 mg by mouth daily.     simethicone (MYLICON) 798 MG chewable tablet Chew 125 mg by mouth in the morning.     simvastatin (ZOCOR) 20 MG tablet Take 20 mg by mouth daily before supper.     Testosterone 20.25 MG/ACT (1.62%) GEL APPLY 4 PUMPS DAILY     Testosterone 40.5 MG/2.5GM (1.62%) GEL Place 4 Pump onto the skin in the morning. Applied to shoulders     traZODone (DESYREL) 50 MG tablet Take 50 mg by mouth at bedtime as needed for sleep.     triamcinolone (NASACORT) 55 MCG/ACT AERO nasal inhaler Place 2 sprays into the nose daily.  TURMERIC CURCUMIN PO Take 1,500 mg by mouth daily.     vitamin C (ASCORBIC ACID) 500 MG tablet Take 500 mg by mouth in the morning.     vitamin E 180 MG (400 UNITS) capsule Take 400 Units by mouth daily.     VOLTAREN 1 % GEL Apply 2 g topically 4 (four) times daily as needed (pain).  3   Zinc 50 MG TABS Take 50 mg by mouth daily.     zolpidem (AMBIEN) 10 MG tablet Take 10 mg by mouth at bedtime as needed for sleep.     umeclidinium-vilanterol (ANORO ELLIPTA) 62.5-25 MCG/INH AEPB Inhale 1 puff into the lungs daily as needed (respiratory issues.).     varenicline (CHANTIX STARTING MONTH PAK) 0.5 MG X 11 & 1 MG X 42 tablet Take one 0.5 mg tablet by mouth once daily for 3 days, then increase to one 0.5 mg tablet twice daily for 4 days, then increase to one 1 mg tablet twice daily. (Patient not taking: No sig reported) 53 tablet 0   No current facility-administered medications for this visit.   Facility-Administered Medications Ordered in Other Visits  Medication Dose Route Frequency Provider Last Rate Last Admin   CARBOplatin (PARAPLATIN) 750 mg in sodium chloride 0.9 % 250 mL chemo infusion  750 mg Intravenous Once Lloyd Huger, MD       heparin lock flush 100 unit/mL  500 Units Intravenous Once Grayland Ormond, Kathlene November, MD       heparin lock flush 100 unit/mL  500 Units Intracatheter Once PRN Lloyd Huger, MD       PACLitaxel (TAXOL) 456 mg in sodium chloride 0.9 % 500 mL chemo infusion (> 59m/m2)  200 mg/m2 (Treatment Plan Recorded) Intravenous Once FLloyd Huger MD 192 mL/hr at 06/18/21 1141 456 mg at 06/18/21 1141   sodium chloride flush (NS) 0.9 % injection 10 mL  10 mL Intracatheter PRN FLloyd Huger MD        OBJECTIVE: Vitals:   06/18/21 0851  BP: 133/83  Pulse: 84  Resp: 16  Temp: 97.8 F (36.6 C)  SpO2: 97%     Body mass index is 24.55 kg/m.    ECOG FS:0 - Asymptomatic  General: Well-developed, well-nourished, no acute distress. Eyes: Pink conjunctiva,  anicteric sclera. HEENT: Normocephalic, moist mucous membranes. Lungs: No audible wheezing or coughing. Heart: Regular rate and rhythm. Abdomen: Soft, nontender, no obvious distention. Musculoskeletal: No edema, cyanosis, or clubbing. Neuro: Alert, answering all questions appropriately. Cranial nerves grossly intact. Skin: No rashes or petechiae noted. Psych: Normal affect.  LAB RESULTS:  Lab Results  Component Value Date   NA 136 06/18/2021   K 4.1 06/18/2021   CL 100 06/18/2021   CO2 28 06/18/2021   GLUCOSE 112 (H) 06/18/2021   BUN 19 06/18/2021   CREATININE 0.61 06/18/2021   CALCIUM 8.0 (L) 06/18/2021   PROT 6.6 06/18/2021   ALBUMIN 3.1 (L) 06/18/2021   AST 30 06/18/2021   ALT 42 06/18/2021   ALKPHOS 77 06/18/2021   BILITOT 0.3 06/18/2021   GFRNONAA >60 06/18/2021   GFRAA >89 08/02/2014    Lab Results  Component Value Date   WBC 22.9 (H) 06/18/2021   NEUTROABS 16.5 (H) 06/18/2021   HGB 11.7 (L) 06/18/2021   HCT 36.4 (L) 06/18/2021   MCV 100.0 06/18/2021   PLT 328 06/18/2021     STUDIES: No results found.   ASSESSMENT: Stage IVA adenocarcinoma of the lung with brain metastasis.  PLAN:    1. Stage IVA adenocarcinoma of the lung with brain metastasis: MRI of the brain from Mar 27, 2021 reviewed independently with a total of 16 lesions consistent with metastatic disease.  Subsequent PET scan on Mar 31, 2021 revealed a hypermetabolic pulmonary nodule in the right apex along with a large right suprahilar nodal mass.  Biopsy confirmed adenocarcinoma of lung origin.  Molecular studies do not appear to have any actionable mutations and PD-L1 is less than 1%.  Patient is receiving carboplatinum, Taxol, and Zirabev with Fulphilia support every 3 weeks for at least 4-6 cycles.  Proceed with cycle 3 of treatment today.  Return to clinic in 2 days for Fulphilia and then in 3 weeks for further evaluation and consideration of cycle 4.  Plan to reimage with PET scan at the  conclusion of cycle 4. 2.  Brain metastasis: Patient completed XRT on May 14, 2021. 3.  Peripheral edema/insomnia: Monitor.  Patient is now back on 4 mg dexamethasone per day for appetite stimulation. 4.  Leukocytosis: Reactive, likely secondary to dexamethasone.  5.  Thrombocytopenia: Resolved. 6.  Poor appetite: Improved with dexamethasone. 7.  Peripheral neuropathy: Mild, monitor.  We will continue with current dose of Taxol, but can consider dose reduction in the future if neuropathy becomes worse.  Patient expressed understanding and was in agreement with this plan. He also understands that He can call clinic at any time with any questions, concerns, or complaints.   Cancer Staging Adenocarcinoma of right lung [facility]) Staging form: Lung, AJCC 8th Edition - Clinical stage from 04/30/2021: Stage IVA (cT2a, cN2, cM1b) - Signed by Lloyd Huger, MD on 04/30/2021  Lloyd Huger, MD   06/18/2021 2:26 PM

## 2021-06-17 ENCOUNTER — Ambulatory Visit: Admission: RE | Admit: 2021-06-17 | Payer: Medicare Other | Source: Ambulatory Visit

## 2021-06-17 DIAGNOSIS — C3491 Malignant neoplasm of unspecified part of right bronchus or lung: Secondary | ICD-10-CM | POA: Diagnosis not present

## 2021-06-17 DIAGNOSIS — Z510 Encounter for antineoplastic radiation therapy: Secondary | ICD-10-CM | POA: Diagnosis not present

## 2021-06-17 DIAGNOSIS — C7931 Secondary malignant neoplasm of brain: Secondary | ICD-10-CM | POA: Diagnosis not present

## 2021-06-18 ENCOUNTER — Encounter: Payer: Self-pay | Admitting: Oncology

## 2021-06-18 ENCOUNTER — Other Ambulatory Visit: Payer: Self-pay

## 2021-06-18 ENCOUNTER — Ambulatory Visit
Admission: RE | Admit: 2021-06-18 | Discharge: 2021-06-18 | Disposition: A | Discharge status: Home / Self Care | Payer: Medicare Other | Source: Ambulatory Visit | Attending: Radiation Oncology | Admitting: Radiation Oncology

## 2021-06-18 ENCOUNTER — Inpatient Hospital Stay: Payer: Medicare Other

## 2021-06-18 ENCOUNTER — Inpatient Hospital Stay (HOSPITAL_BASED_OUTPATIENT_CLINIC_OR_DEPARTMENT_OTHER): Payer: Medicare Other | Admitting: Oncology

## 2021-06-18 ENCOUNTER — Inpatient Hospital Stay: Payer: Medicare Other | Admitting: Occupational Therapy

## 2021-06-18 ENCOUNTER — Inpatient Hospital Stay: Payer: Medicare Other | Attending: Oncology

## 2021-06-18 VITALS — BP 133/83 | HR 84 | Temp 97.8°F | Resp 16 | Wt 201.7 lb

## 2021-06-18 DIAGNOSIS — C3401 Malignant neoplasm of right main bronchus: Secondary | ICD-10-CM | POA: Diagnosis present

## 2021-06-18 DIAGNOSIS — C3491 Malignant neoplasm of unspecified part of right bronchus or lung: Secondary | ICD-10-CM

## 2021-06-18 DIAGNOSIS — Z5111 Encounter for antineoplastic chemotherapy: Secondary | ICD-10-CM

## 2021-06-18 DIAGNOSIS — I10 Essential (primary) hypertension: Secondary | ICD-10-CM | POA: Insufficient documentation

## 2021-06-18 DIAGNOSIS — C7931 Secondary malignant neoplasm of brain: Secondary | ICD-10-CM | POA: Insufficient documentation

## 2021-06-18 DIAGNOSIS — M6281 Muscle weakness (generalized): Secondary | ICD-10-CM

## 2021-06-18 DIAGNOSIS — Z5189 Encounter for other specified aftercare: Secondary | ICD-10-CM | POA: Diagnosis not present

## 2021-06-18 DIAGNOSIS — Z79899 Other long term (current) drug therapy: Secondary | ICD-10-CM | POA: Insufficient documentation

## 2021-06-18 DIAGNOSIS — F17210 Nicotine dependence, cigarettes, uncomplicated: Secondary | ICD-10-CM | POA: Diagnosis not present

## 2021-06-18 DIAGNOSIS — Z7982 Long term (current) use of aspirin: Secondary | ICD-10-CM | POA: Diagnosis not present

## 2021-06-18 DIAGNOSIS — Z5112 Encounter for antineoplastic immunotherapy: Secondary | ICD-10-CM | POA: Insufficient documentation

## 2021-06-18 LAB — URINALYSIS, DIPSTICK ONLY
Bilirubin Urine: NEGATIVE
Glucose, UA: NEGATIVE mg/dL
Hgb urine dipstick: NEGATIVE
Ketones, ur: 5 mg/dL — AB
Leukocytes,Ua: NEGATIVE
Nitrite: NEGATIVE
Protein, ur: NEGATIVE mg/dL
Specific Gravity, Urine: 1.033 — ABNORMAL HIGH (ref 1.005–1.030)
pH: 5 (ref 5.0–8.0)

## 2021-06-18 LAB — CBC WITH DIFFERENTIAL/PLATELET
Abs Immature Granulocytes: 0.21 10*3/uL — ABNORMAL HIGH (ref 0.00–0.07)
Basophils Absolute: 0.1 10*3/uL (ref 0.0–0.1)
Basophils Relative: 0 %
Eosinophils Absolute: 0 10*3/uL (ref 0.0–0.5)
Eosinophils Relative: 0 %
HCT: 36.4 % — ABNORMAL LOW (ref 39.0–52.0)
Hemoglobin: 11.7 g/dL — ABNORMAL LOW (ref 13.0–17.0)
Immature Granulocytes: 1 %
Lymphocytes Relative: 20 %
Lymphs Abs: 4.6 10*3/uL — ABNORMAL HIGH (ref 0.7–4.0)
MCH: 32.1 pg (ref 26.0–34.0)
MCHC: 32.1 g/dL (ref 30.0–36.0)
MCV: 100 fL (ref 80.0–100.0)
Monocytes Absolute: 1.5 10*3/uL — ABNORMAL HIGH (ref 0.1–1.0)
Monocytes Relative: 6 %
Neutro Abs: 16.5 10*3/uL — ABNORMAL HIGH (ref 1.7–7.7)
Neutrophils Relative %: 73 %
Platelets: 328 10*3/uL (ref 150–400)
RBC: 3.64 MIL/uL — ABNORMAL LOW (ref 4.22–5.81)
RDW: 19.2 % — ABNORMAL HIGH (ref 11.5–15.5)
Smear Review: NORMAL
WBC: 22.9 10*3/uL — ABNORMAL HIGH (ref 4.0–10.5)
nRBC: 0.1 % (ref 0.0–0.2)

## 2021-06-18 LAB — COMPREHENSIVE METABOLIC PANEL
ALT: 42 U/L (ref 0–44)
AST: 30 U/L (ref 15–41)
Albumin: 3.1 g/dL — ABNORMAL LOW (ref 3.5–5.0)
Alkaline Phosphatase: 77 U/L (ref 38–126)
Anion gap: 8 (ref 5–15)
BUN: 19 mg/dL (ref 8–23)
CO2: 28 mmol/L (ref 22–32)
Calcium: 8 mg/dL — ABNORMAL LOW (ref 8.9–10.3)
Chloride: 100 mmol/L (ref 98–111)
Creatinine, Ser: 0.61 mg/dL (ref 0.61–1.24)
GFR, Estimated: >60 mL/min (ref >60)
Glucose, Bld: 112 mg/dL — ABNORMAL HIGH (ref 70–99)
Potassium: 4.1 mmol/L (ref 3.5–5.1)
Sodium: 136 mmol/L (ref 135–145)
Total Bilirubin: 0.3 mg/dL (ref 0.3–1.2)
Total Protein: 6.6 g/dL (ref 6.5–8.1)

## 2021-06-18 MED ORDER — HEPARIN SOD (PORK) LOCK FLUSH 100 UNIT/ML IV SOLN
500.0000 [IU] | Freq: Once | INTRAVENOUS | Status: DC | PRN
Start: 2021-06-18 — End: 2021-06-18
  Filled 2021-06-18: qty 5

## 2021-06-18 MED ORDER — SODIUM CHLORIDE 0.9 % IV SOLN
Freq: Once | INTRAVENOUS | Status: AC
  Filled 2021-06-18: qty 250

## 2021-06-18 MED ORDER — SODIUM CHLORIDE 0.9 % IV SOLN
748.2000 mg | Freq: Once | INTRAVENOUS | Status: AC
  Administered 2021-06-18: 750 mg via INTRAVENOUS
  Filled 2021-06-18: qty 75

## 2021-06-18 MED ORDER — SODIUM CHLORIDE 0.9% FLUSH
10.0000 mL | Freq: Once | INTRAVENOUS | Status: AC
  Administered 2021-06-18: 10 mL via INTRAVENOUS
  Filled 2021-06-18: qty 10

## 2021-06-18 MED ORDER — DIPHENHYDRAMINE HCL 50 MG/ML IJ SOLN
25.0000 mg | Freq: Once | INTRAMUSCULAR | Status: AC
  Administered 2021-06-18: 25 mg via INTRAVENOUS
  Filled 2021-06-18: qty 1

## 2021-06-18 MED ORDER — PACLITAXEL CHEMO INJECTION 300 MG/50ML
200.0000 mg/m2 | Freq: Once | INTRAVENOUS | Status: AC
  Administered 2021-06-18: 456 mg via INTRAVENOUS
  Filled 2021-06-18: qty 76

## 2021-06-18 MED ORDER — PALONOSETRON HCL INJECTION 0.25 MG/5ML
0.2500 mg | Freq: Once | INTRAVENOUS | Status: AC
  Administered 2021-06-18: 0.25 mg via INTRAVENOUS
  Filled 2021-06-18: qty 5

## 2021-06-18 MED ORDER — HEPARIN SOD (PORK) LOCK FLUSH 100 UNIT/ML IV SOLN
500.0000 [IU] | Freq: Once | INTRAVENOUS | Status: AC
  Administered 2021-06-18: 500 [IU] via INTRAVENOUS
  Filled 2021-06-18: qty 5

## 2021-06-18 MED ORDER — FAMOTIDINE 20 MG IN NS 100 ML IVPB
20.0000 mg | Freq: Once | INTRAVENOUS | Status: AC
  Administered 2021-06-18: 20 mg via INTRAVENOUS
  Filled 2021-06-18: qty 20

## 2021-06-18 MED ORDER — FOSAPREPITANT DIMEGLUMINE INJECTION 150 MG
150.0000 mg | Freq: Once | INTRAVENOUS | Status: AC
  Administered 2021-06-18: 150 mg via INTRAVENOUS
  Filled 2021-06-18: qty 150

## 2021-06-18 MED ORDER — SODIUM CHLORIDE 0.9% FLUSH
10.0000 mL | INTRAVENOUS | Status: DC | PRN
  Filled 2021-06-18: qty 10

## 2021-06-18 MED ORDER — SODIUM CHLORIDE 0.9 % IV SOLN
15.0000 mg/kg | Freq: Once | INTRAVENOUS | Status: AC
  Administered 2021-06-18: 1400 mg via INTRAVENOUS
  Filled 2021-06-18: qty 48

## 2021-06-18 MED ORDER — HEPARIN SOD (PORK) LOCK FLUSH 100 UNIT/ML IV SOLN
INTRAVENOUS | Status: AC
  Filled 2021-06-18: qty 5

## 2021-06-18 MED ORDER — SODIUM CHLORIDE 0.9 % IV SOLN
10.0000 mg | Freq: Once | INTRAVENOUS | Status: AC
  Administered 2021-06-18: 10 mg via INTRAVENOUS
  Filled 2021-06-18: qty 10

## 2021-06-18 NOTE — Therapy (Signed)
[facility] [place], [place], [place], [address] Phone: [phone]   Fax:  [phone]  Occupational Therapy Screen:  Patient Details  Name: Matthew Kelly MRN: [id number] Date of Birth: Jan 06, 1956 No data recorded  Encounter Date: 06/18/2021   OT End of Session - 06/18/21 0939     Visit Number 0             Past Medical History:  Diagnosis Date   Anxiety    Aortic atherosclerosis ([place])    Aortic stenosis    s/p AVR in 05/2013   Arthritis    Bicuspid aortic valve    Bronchogenic carcinoma of right lung ([place])    MRI confirmed brain metastasis   CHF (congestive heart failure) ([place])    COPD (chronic obstructive pulmonary disease) ([place])    DDD (degenerative disc disease), lumbar    Dysplastic nevus 12/30/2020   R post waistline - moderate   Erectile dysfunction    GERD (gastroesophageal reflux disease)    Heart murmur    Hepatitis C    dx'd 1980s.  treated with Harvoni approx 2015   HLD (hyperlipidemia)    Homonymous hemianopia, left    Hypertension    Hypogonadism in male    Insomnia    Pericarditis    Spinal stenosis, lumbar 07/25/2019   severe   Squamous cell carcinoma of skin 12/18/2019   SCCIS L lat ankle   Squamous cell carcinoma of skin 11/07/2008   SCCIS L wrist   Thoracic aortic aneurysm [facility])    s/p aortic arch/hemi-arch repair in 05/2013    Past Surgical History:  Procedure Laterality Date   AORTIC VALVE REPLACEMENT N/A 06/01/2013   Procedure: REPLACEMENT AORTIC VALVE; Surgeon: Gretchen Portela, MD; Location: [facility]; Service: Cardiothoracic; Laterality: N/A; Bioprosthetic 25 mm St. Jude trifecta bovine pericardial tissue valve   CATARACT EXTRACTION W/PHACO Right 12/17/2020   Procedure: CATARACT EXTRACTION PHACO AND INTRAOCULAR LENS PLACEMENT ([place]) RIGHT 6.67 00:49.5;  Surgeon: Birder Robson, MD;  Location: [facility];  Service: Ophthalmology;   Laterality: Right;   COLONOSCOPY WITH PROPOFOL N/A 01/04/2017   Procedure: COLONOSCOPY WITH PROPOFOL;  Surgeon: Garlan Fair, MD;  [facility];  Service: Endoscopy;  Laterality: N/A;   IR IMAGING GUIDED PORT INSERTION  04/11/2021   ORCHIECTOMY Left 2000   REPAIR THORACIC AORTA N/A 06/01/2013   Procedure: REPAIR THORACIC AORTIC ANEURYSM W/TRANSVERSE ARCH GRAFT - HemiArch; Surgeon: Gretchen Portela, MD; Location: [facility]; Service: Cardiothoracic; Laterality: N/A; #24 mm Dacron graft   VIDEO BRONCHOSCOPY WITH ENDOBRONCHIAL NAVIGATION N/A 04/18/2021   Procedure: VIDEO BRONCHOSCOPY WITH ENDOBRONCHIAL NAVIGATION;  Surgeon: [name], MD;  Location: [facility];  Service: Thoracic;  Laterality: N/A;   VIDEO BRONCHOSCOPY WITH ENDOBRONCHIAL ULTRASOUND N/A 04/18/2021   Procedure: VIDEO BRONCHOSCOPY WITH ENDOBRONCHIAL ULTRASOUND;  Surgeon: [name], MD;  Location: [facility];  Service: Thoracic;  Laterality: N/A;    There were no vitals filed for this visit.   Subjective Assessment - 06/18/21 0938     Subjective  I am not able to do what I use to do - about month or 2 ago I could mow the lawn and get up when I down on the ground - getting off the toilet - now I have to push up - strength is decrease    Currently in Pain? No/denies              NOTE FROM DR  CRYSTAL ON 06/09/21: HPI: Patient is a 65 year old male now at 1 month having completed whole brain radiation therapy for palliation in patient with known stage IV adenocarcinoma of the right lung.  From neurologic standpoint he is doing well his vision is improved still has some flashes of light occasionally.  He is currently undergoing. carboplatinum, Taxol, and Avastin with Neulasta support every 3 weeks for at least 4-6 cycles.  He has a productive cough currently on antibiotic therapy.  No hemoptysis. Starting 06/18/21 radiation to lungs  OT SCREEN 06/18/21:  Pt was refer to palliative care NP - because of  fatigue and increase weakness. Pt denies any falls. Weakness and harder time getting up from squatting, chair and toilet. Pt not as active the last month or 2 per pt and wife - use to mow the lawn and do yard work. Pt show decrease hip flexion, ABD, and ext  Sit <> stand need to use hands - standing feet together or tandem stand impaired because of neuropathy in feet Pt score on BERG balance test 46/56 - low risk for falling  But hard time sit<> stand and transfer needs hands, stand unsupported heel to toe, close eyes and standing on one leg - Decreased  Pt and wife was ed on HEP for 3 wks: -sit<> stand from firm chair - but use 2-3 pillows- 3 x day 8-10 reps -side stepping at counter - 2 min - L and R  -standing at counter - hip ABD and Ext- 10 reps R and L - can alternate extention- if back pain - can do tapping  Focus on core stabilization and small movement  - to do exercises , walking or stairs -150 min week, 68min day , 2 x 12 min day or 4 x 6 min day   Hand out provided - return for follow up in 3 wks                                Patient will benefit from skilled therapeutic intervention in order to improve the following deficits and impairments:           Visit Diagnosis: Muscle weakness (generalized)    Problem List Patient Active Problem List   Diagnosis Date Noted   Adenocarcinoma of right lung ([place]) 04/29/2021   Hepatic cirrhosis ([place]) 07/19/2014   Chronic hepatitis C without hepatic coma ([place]) 06/20/2014   Essential hypertension, benign 06/20/2014   Other and unspecified hyperlipidemia 06/20/2014   Insomnia 06/20/2014   Allergic rhinitis 06/20/2014   Hypogonadism in male 06/20/2014   Low back pain 06/20/2014   Aortic valve disorders 06/20/2014   Mass of upper lobe of right lung 06/20/2014    Rosalyn Gess OTR/L,CLT 06/18/2021, 9:40 AM  [facility] [address], [place], [place], [address] Phone: [phone]   Fax:  [phone]  Name: Matthew Kelly MRN: [id number] Date of Birth: Aug 16, 1956

## 2021-06-18 NOTE — Progress Notes (Signed)
Completed amoxicillin for sore throat/laryngitis. Fevers resolved. Cough is improved. Voice is still hoarse. Has neuropathy in finger pads and balls of feet. States headaches are consistent usually daily after chemotherapy.Reports he started back on dexamethasone for appetite stimulation.

## 2021-06-18 NOTE — Progress Notes (Signed)
Nutrition Assessment:  65 year old male with stage IV right lung cancer with brain mets.  Past medical history of CHF, COPD, GERD, Hep C, HLD, HTN.  Patient completed whole brain radiation.  Receiving chemotherapy and planning to start palliative radiation to right lung.    Met with patient during infusion.  Patient reports that his appetite is a little bit better than it has been.  Reports that he usually eats pop tarts for breakfast and sometimes 1/2 banana with juice and coffee.  Lunch he is usually not hungry.  Yesterday ate peanut butter crackers and drank a boost high protein.  Last night for dinner ate [employer] chicken, mashed potatoes and coleslaw and had dessert. Reports that he is taking dexamethasone.  Also says that he is taking marinol that PCP prescribed but not consistently. Reports appetite really goes down after receiving injection and feels bad for about 4 days.      Medications: reviewed  Labs: reviewed  Anthropometrics:   Height: 76 inches Weight: 201 lb 11.2 oz today 6/22 214 lb 5/27 202 lb BMI: 24  6% weight loss in the last month, significant  Estimated Energy Needs  Kcals: 2275-2700 Protein: 109-136 g Fluid: 2.2 L  NUTRITION DIAGNOSIS: Inadequate oral intake related to cancer and cancer related treatment side effects as evidenced by 6% weight loss in the last month and overall decrease appetite    INTERVENTION:  Recommend high calorie shake (350 calories or more).  These shakes will also be higher in sugar but patient needs additional calories with significant weight loss and decreased appetite.   Would liberalize diet and control blood glucose with medication if needed.   Discussed ways to increase calories and protein.  Handout provided Contact information given    MONITORING, EVALUATION, GOAL: weight trends, intake   NEXT VISIT: Wednesday, August 24 during infusion  Norvil Martensen B. Zenia Resides, [place], [place] Registered Dietitian [phone] (mobile)

## 2021-06-18 NOTE — Patient Instructions (Signed)
[facility]  Discharge Instructions: Thank you for choosing [facility] to provide your oncology and hematology care.  If you have a lab appointment with the [facility], please go directly to the [facility] and check in at the registration area.  Wear comfortable clothing and clothing appropriate for easy access to any Portacath or PICC line.   We strive to give you quality time with your provider. You may need to reschedule your appointment if you arrive late (15 or more minutes).  Arriving late affects you and other patients whose appointments are after yours.  Also, if you miss three or more appointments without notifying the office, you may be dismissed from the clinic at the provider's discretion.      For prescription refill requests, have your pharmacy contact our office and allow 72 hours for refills to be completed.    Today you received the following chemotherapy and/or immunotherapy agents - bevacizumab, paclitaxel, carboplatin      To help prevent nausea and vomiting after your treatment, we encourage you to take your nausea medication as directed.  BELOW ARE SYMPTOMS THAT SHOULD BE REPORTED IMMEDIATELY: *FEVER GREATER THAN 100.4 F (38 C) OR HIGHER *CHILLS OR SWEATING *NAUSEA AND VOMITING THAT IS NOT CONTROLLED WITH YOUR NAUSEA MEDICATION *UNUSUAL SHORTNESS OF BREATH *UNUSUAL BRUISING OR BLEEDING *URINARY PROBLEMS (pain or burning when urinating, or frequent urination) *BOWEL PROBLEMS (unusual diarrhea, constipation, pain near the anus) TENDERNESS IN MOUTH AND THROAT WITH OR WITHOUT PRESENCE OF ULCERS (sore throat, sores in mouth, or a toothache) UNUSUAL RASH, SWELLING OR PAIN  UNUSUAL VAGINAL DISCHARGE OR ITCHING   Items with * indicate a potential emergency and should be followed up as soon as possible or go to the Emergency Department if any problems should occur.  Please show the CHEMOTHERAPY ALERT CARD or  IMMUNOTHERAPY ALERT CARD at check-in to the Emergency Department and triage nurse.  Should you have questions after your visit or need to cancel or reschedule your appointment, please contact [facility]  [phone] and follow the prompts.  Office hours are 8:00 a.m. to 4:30 p.m. Monday - Friday. Please note that voicemails left after 4:00 p.m. may not be returned until the following business day.  We are closed weekends and major holidays. You have access to a nurse at all times for urgent questions. Please call the main number to the clinic [phone] and follow the prompts.  For any non-urgent questions, you may also contact your provider using MyChart. We now offer e-Visits for anyone 41 and older to request care online for non-urgent symptoms. For details visit [email or web address].   Also download the MyChart app! Go to the app store, search "MyChart", open the app, select [facility], and log in with your MyChart username and password.  Due to Covid, a mask is required upon entering the hospital/clinic. If you do not have a mask, one will be given to you upon arrival. For doctor visits, patients may have 1 support person aged 59 or older with them. For treatment visits, patients cannot have anyone with them due to current Covid guidelines and our immunocompromised population.   Bevacizumab injection What is this medication? BEVACIZUMAB (be va SIZ yoo mab) is a monoclonal antibody. It is used to treatmany types of cancer. This medicine may be used for other purposes; ask your health care provider orpharmacist if you have questions. COMMON BRAND NAME(S): Avastin, MVASI, Zirabev What should I tell  my care team before I take this medication? They need to know if you have any of these conditions: diabetes heart disease high blood pressure history of coughing up blood prior anthracycline chemotherapy (e.g., doxorubicin, daunorubicin,  epirubicin) recent or ongoing radiation therapy recent or planning to have surgery stroke an unusual or allergic reaction to bevacizumab, hamster proteins, mouse proteins, other medicines, foods, dyes, or preservatives pregnant or trying to get pregnant breast-feeding How should I use this medication? This medicine is for infusion into a vein. It is given by a health careprofessional in a hospital or clinic setting. Talk to your pediatrician regarding the use of this medicine in children.Special care may be needed. Overdosage: If you think you have taken too much of this medicine contact apoison control center or emergency room at once. NOTE: This medicine is only for you. Do not share this medicine with others. What if I miss a dose? It is important not to miss your dose. Call your doctor or health careprofessional if you are unable to keep an appointment. What may interact with this medication? Interactions are not expected. This list may not describe all possible interactions. Give your health care provider a list of all the medicines, herbs, non-prescription drugs, or dietary supplements you use. Also tell them if you smoke, drink alcohol, or use illegaldrugs. Some items may interact with your medicine. What should I watch for while using this medication? Your condition will be monitored carefully while you are receiving this medicine. You will need important blood work and urine testing done while youare taking this medicine. This medicine may increase your risk to bruise or bleed. Call your doctor orhealth care professional if you notice any unusual bleeding. Before having surgery, talk to your health care provider to make sure it is ok. This drug can increase the risk of poor healing of your surgical site or wound. You will need to stop this drug for 28 days before surgery. After surgery, wait at least 28 days before restarting this drug. Make sure the surgical site or wound is healed  enough before restarting this drug. Talk to your health careprovider if questions. Do not become pregnant while taking this medicine or for 6 months after stopping it. Women should inform their doctor if they wish to become pregnant or think they might be pregnant. There is a potential for serious side effects to an unborn child. Talk to your health care professional or pharmacist for more information. Do not breast-feed an infant while taking this medicine andfor 6 months after the last dose. This medicine has caused ovarian failure in some women. This medicine may interfere with the ability to have a child. You should talk to your doctor orhealth care professional if you are concerned about your fertility. What side effects may I notice from receiving this medication? Side effects that you should report to your doctor or health care professionalas soon as possible: allergic reactions like skin rash, itching or hives, swelling of the face, lips, or tongue chest pain or chest tightness chills coughing up blood high fever seizures severe constipation signs and symptoms of bleeding such as bloody or black, tarry stools; red or dark-brown urine; spitting up blood or brown material that looks like coffee grounds; red spots on the skin; unusual bruising or bleeding from the eye, gums, or nose signs and symptoms of a blood clot such as breathing problems; chest pain; severe, sudden headache; pain, swelling, warmth in the leg signs and symptoms of a stroke  like changes in vision; confusion; trouble speaking or understanding; severe headaches; sudden numbness or weakness of the face, arm or leg; trouble walking; dizziness; loss of balance or coordination stomach pain sweating swelling of legs or ankles vomiting weight gain Side effects that usually do not require medical attention (report to yourdoctor or health care professional if they continue or are bothersome): back pain changes in  taste decreased appetite dry skin nausea tiredness This list may not describe all possible side effects. Call your doctor for medical advice about side effects. You may report side effects to FDA at1-800-FDA-1088. Where should I keep my medication? This drug is given in a hospital or clinic and will not be stored at home. NOTE: This sheet is a summary. It may not cover all possible information. If you have questions about this medicine, talk to your doctor, pharmacist, orhealth care provider.  2022 Elsevier/Gold Standard (2019-08-30 10:50:46)  Paclitaxel injection What is this medication? PACLITAXEL (PAK li TAX el) is a chemotherapy drug. It targets fast dividing cells, like cancer cells, and causes these cells to die. This medicine is used to treat ovarian cancer, breast cancer, lung cancer, Kaposi's sarcoma, andother cancers. This medicine may be used for other purposes; ask your health care provider orpharmacist if you have questions. COMMON BRAND NAME(S): Onxol, Taxol What should I tell my care team before I take this medication? They need to know if you have any of these conditions: history of irregular heartbeat liver disease low blood counts, like low white cell, platelet, or red cell counts lung or breathing disease, like asthma tingling of the fingers or toes, or other nerve disorder an unusual or allergic reaction to paclitaxel, alcohol, polyoxyethylated castor oil, other chemotherapy, other medicines, foods, dyes, or preservatives pregnant or trying to get pregnant breast-feeding How should I use this medication? This drug is given as an infusion into a vein. It is administered in a hospitalor clinic by a specially trained health care professional. Talk to your pediatrician regarding the use of this medicine in children.Special care may be needed. Overdosage: If you think you have taken too much of this medicine contact apoison control center or emergency room at once. NOTE:  This medicine is only for you. Do not share this medicine with others. What if I miss a dose? It is important not to miss your dose. Call your doctor or health careprofessional if you are unable to keep an appointment. What may interact with this medication? Do not take this medicine with any of the following medications: live virus vaccines This medicine may also interact with the following medications: antiviral medicines for hepatitis, HIV or AIDS certain antibiotics like erythromycin and clarithromycin certain medicines for fungal infections like ketoconazole and itraconazole certain medicines for seizures like carbamazepine, phenobarbital, phenytoin gemfibrozil nefazodone rifampin St. John's wort This list may not describe all possible interactions. Give your health care provider a list of all the medicines, herbs, non-prescription drugs, or dietary supplements you use. Also tell them if you smoke, drink alcohol, or use illegaldrugs. Some items may interact with your medicine. What should I watch for while using this medication? Your condition will be monitored carefully while you are receiving this medicine. You will need important blood work done while you are taking thismedicine. This medicine can cause serious allergic reactions. To reduce your risk you will need to take other medicine(s) before treatment with this medicine. If you experience allergic reactions like skin rash, itching or hives, swelling of theface, lips, or  tongue, tell your doctor or health care professional right away. In some cases, you may be given additional medicines to help with side effects.Follow all directions for their use. This drug may make you feel generally unwell. This is not uncommon, as chemotherapy can affect healthy cells as well as cancer cells. Report any side effects. Continue your course of treatment even though you feel ill unless yourdoctor tells you to stop. Call your doctor or health care  professional for advice if you get a fever, chills or sore throat, or other symptoms of a cold or flu. Do not treat yourself. This drug decreases your body's ability to fight infections. Try toavoid being around people who are sick. This medicine may increase your risk to bruise or bleed. Call your doctor orhealth care professional if you notice any unusual bleeding. Be careful brushing and flossing your teeth or using a toothpick because you may get an infection or bleed more easily. If you have any dental work done,tell your dentist you are receiving this medicine. Avoid taking products that contain aspirin, acetaminophen, ibuprofen, naproxen, or ketoprofen unless instructed by your doctor. These medicines may hide afever. Do not become pregnant while taking this medicine. Women should inform their doctor if they wish to become pregnant or think they might be pregnant. There is a potential for serious side effects to an unborn child. Talk to your health care professional or pharmacist for more information. Do not breast-feed aninfant while taking this medicine. Men are advised not to father a child while receiving this medicine. This product may contain alcohol. Ask your pharmacist or healthcare provider if this medicine contains alcohol. Be sure to tell all healthcare providers you are taking this medicine. Certain medicines, like metronidazole and disulfiram, can cause an unpleasant reaction when taken with alcohol. The reaction includes flushing, headache, nausea, vomiting, sweating, and increased thirst. Thereaction can last from 30 minutes to several hours. What side effects may I notice from receiving this medication? Side effects that you should report to your doctor or health care professionalas soon as possible: allergic reactions like skin rash, itching or hives, swelling of the face, lips, or tongue breathing problems changes in vision fast, irregular heartbeat high or low blood  pressure mouth sores pain, tingling, numbness in the hands or feet signs of decreased platelets or bleeding - bruising, pinpoint red spots on the skin, black, tarry stools, blood in the urine signs of decreased red blood cells - unusually weak or tired, feeling faint or lightheaded, falls signs of infection - fever or chills, cough, sore throat, pain or difficulty passing urine signs and symptoms of liver injury like dark yellow or brown urine; general ill feeling or flu-like symptoms; light-colored stools; loss of appetite; nausea; right upper belly pain; unusually weak or tired; yellowing of the eyes or skin swelling of the ankles, feet, hands unusually slow heartbeat Side effects that usually do not require medical attention (report to yourdoctor or health care professional if they continue or are bothersome): diarrhea hair loss loss of appetite muscle or joint pain nausea, vomiting pain, redness, or irritation at site where injected tiredness This list may not describe all possible side effects. Call your doctor for medical advice about side effects. You may report side effects to FDA at1-800-FDA-1088. Where should I keep my medication? This drug is given in a hospital or clinic and will not be stored at home. NOTE: This sheet is a summary. It may not cover all possible information. If you have  questions about this medicine, talk to your doctor, pharmacist, orhealth care provider.  2022 Elsevier/Gold Standard (2019-10-04 13:37:23)  Carboplatin injection What is this medication? CARBOPLATIN (KAR boe pla tin) is a chemotherapy drug. It targets fast dividing cells, like cancer cells, and causes these cells to die. This medicine is usedto treat ovarian cancer and many other cancers. This medicine may be used for other purposes; ask your health care provider orpharmacist if you have questions. COMMON BRAND NAME(S): Paraplatin What should I tell my care team before I take this  medication? They need to know if you have any of these conditions: blood disorders hearing problems kidney disease recent or ongoing radiation therapy an unusual or allergic reaction to carboplatin, cisplatin, other chemotherapy, other medicines, foods, dyes, or preservatives pregnant or trying to get pregnant breast-feeding How should I use this medication? This drug is usually given as an infusion into a vein. It is administered in [facility] or clinic by a specially trained health care professional. Talk to your pediatrician regarding the use of this medicine in children.Special care may be needed. Overdosage: If you think you have taken too much of this medicine contact apoison control center or emergency room at once. NOTE: This medicine is only for you. Do not share this medicine with others. What if I miss a dose? It is important not to miss a dose. Call your doctor or health careprofessional if you are unable to keep an appointment. What may interact with this medication? medicines for seizures medicines to increase blood counts like filgrastim, pegfilgrastim, sargramostim some antibiotics like amikacin, gentamicin, neomycin, streptomycin, tobramycin vaccines Talk to your doctor or health care professional before taking any of thesemedicines: acetaminophen aspirin ibuprofen ketoprofen naproxen This list may not describe all possible interactions. Give your health care provider a list of all the medicines, herbs, non-prescription drugs, or dietary supplements you use. Also tell them if you smoke, drink alcohol, or use illegaldrugs. Some items may interact with your medicine. What should I watch for while using this medication? Your condition will be monitored carefully while you are receiving this medicine. You will need important blood work done while you are taking thismedicine. This drug may make you feel generally unwell. This is not uncommon, as chemotherapy can affect  healthy cells as well as cancer cells. Report any side effects. Continue your course of treatment even though you feel ill unless yourdoctor tells you to stop. In some cases, you may be given additional medicines to help with side effects.Follow all directions for their use. Call your doctor or health care professional for advice if you get a fever, chills or sore throat, or other symptoms of a cold or flu. Do not treat yourself. This drug decreases your body's ability to fight infections. Try toavoid being around people who are sick. This medicine may increase your risk to bruise or bleed. Call your doctor orhealth care professional if you notice any unusual bleeding. Be careful brushing and flossing your teeth or using a toothpick because you may get an infection or bleed more easily. If you have any dental work done,tell your dentist you are receiving this medicine. Avoid taking products that contain aspirin, acetaminophen, ibuprofen, naproxen, or ketoprofen unless instructed by your doctor. These medicines may hide afever. Do not become pregnant while taking this medicine. Women should inform their doctor if they wish to become pregnant or think they might be pregnant. There is a potential for serious side effects to an unborn child. Talk to your  health care professional or pharmacist for more information. Do not breast-feed aninfant while taking this medicine. What side effects may I notice from receiving this medication? Side effects that you should report to your doctor or health care professionalas soon as possible: allergic reactions like skin rash, itching or hives, swelling of the face, lips, or tongue signs of infection - fever or chills, cough, sore throat, pain or difficulty passing urine signs of decreased platelets or bleeding - bruising, pinpoint red spots on the skin, black, tarry stools, nosebleeds signs of decreased red blood cells - unusually weak or tired, fainting spells,  lightheadedness breathing problems changes in hearing changes in vision chest pain high blood pressure low blood counts - This drug may decrease the number of white blood cells, red blood cells and platelets. You may be at increased risk for infections and bleeding. nausea and vomiting pain, swelling, redness or irritation at the injection site pain, tingling, numbness in the hands or feet problems with balance, talking, walking trouble passing urine or change in the amount of urine Side effects that usually do not require medical attention (report to yourdoctor or health care professional if they continue or are bothersome): hair loss loss of appetite metallic taste in the mouth or changes in taste This list may not describe all possible side effects. Call your doctor for medical advice about side effects. You may report side effects to FDA at1-800-FDA-1088. Where should I keep my medication? This drug is given in a hospital or clinic and will not be stored at home. NOTE: This sheet is a summary. It may not cover all possible information. If you have questions about this medicine, talk to your doctor, pharmacist, orhealth care provider.  2022 Elsevier/Gold Standard (2008-02-07 14:38:05)

## 2021-06-19 ENCOUNTER — Encounter: Payer: Self-pay | Admitting: Oncology

## 2021-06-19 ENCOUNTER — Ambulatory Visit
Admission: RE | Admit: 2021-06-19 | Discharge: 2021-06-19 | Disposition: A | Discharge status: Home / Self Care | Payer: Medicare Other | Source: Ambulatory Visit | Attending: Radiation Oncology | Admitting: Radiation Oncology

## 2021-06-19 DIAGNOSIS — C3491 Malignant neoplasm of unspecified part of right bronchus or lung: Secondary | ICD-10-CM | POA: Diagnosis not present

## 2021-06-20 ENCOUNTER — Inpatient Hospital Stay: Payer: Medicare Other

## 2021-06-20 ENCOUNTER — Other Ambulatory Visit: Payer: Self-pay

## 2021-06-20 ENCOUNTER — Ambulatory Visit
Admission: RE | Admit: 2021-06-20 | Discharge: 2021-06-20 | Disposition: A | Discharge status: Home / Self Care | Payer: Medicare Other | Source: Ambulatory Visit | Attending: Radiation Oncology | Admitting: Radiation Oncology

## 2021-06-20 DIAGNOSIS — C3491 Malignant neoplasm of unspecified part of right bronchus or lung: Secondary | ICD-10-CM

## 2021-06-20 DIAGNOSIS — Z5112 Encounter for antineoplastic immunotherapy: Secondary | ICD-10-CM | POA: Diagnosis not present

## 2021-06-20 MED ORDER — PEGFILGRASTIM-CBQV 6 MG/0.6ML ~~LOC~~ SOSY
6.0000 mg | PREFILLED_SYRINGE | Freq: Once | SUBCUTANEOUS | Status: AC
  Administered 2021-06-20: 6 mg via SUBCUTANEOUS
  Filled 2021-06-20: qty 0.6

## 2021-06-23 ENCOUNTER — Ambulatory Visit
Admission: RE | Admit: 2021-06-23 | Discharge: 2021-06-23 | Disposition: A | Discharge status: Home / Self Care | Payer: Medicare Other | Source: Ambulatory Visit | Attending: Radiation Oncology | Admitting: Radiation Oncology

## 2021-06-23 DIAGNOSIS — C3491 Malignant neoplasm of unspecified part of right bronchus or lung: Secondary | ICD-10-CM | POA: Diagnosis not present

## 2021-06-24 ENCOUNTER — Ambulatory Visit
Admission: RE | Admit: 2021-06-24 | Discharge: 2021-06-24 | Disposition: A | Discharge status: Home / Self Care | Payer: Medicare Other | Source: Ambulatory Visit | Attending: Radiation Oncology | Admitting: Radiation Oncology

## 2021-06-24 DIAGNOSIS — C3491 Malignant neoplasm of unspecified part of right bronchus or lung: Secondary | ICD-10-CM | POA: Diagnosis not present

## 2021-06-25 ENCOUNTER — Ambulatory Visit
Admission: RE | Admit: 2021-06-25 | Discharge: 2021-06-25 | Disposition: A | Discharge status: Home / Self Care | Payer: Medicare Other | Source: Ambulatory Visit | Attending: Radiation Oncology | Admitting: Radiation Oncology

## 2021-06-25 DIAGNOSIS — C3491 Malignant neoplasm of unspecified part of right bronchus or lung: Secondary | ICD-10-CM | POA: Diagnosis not present

## 2021-06-26 ENCOUNTER — Encounter: Payer: Self-pay | Admitting: Oncology

## 2021-06-26 ENCOUNTER — Ambulatory Visit
Admission: RE | Admit: 2021-06-26 | Discharge: 2021-06-26 | Disposition: A | Discharge status: Home / Self Care | Payer: Medicare Other | Source: Ambulatory Visit | Attending: Radiation Oncology | Admitting: Radiation Oncology

## 2021-06-26 DIAGNOSIS — C3491 Malignant neoplasm of unspecified part of right bronchus or lung: Secondary | ICD-10-CM | POA: Diagnosis not present

## 2021-06-27 ENCOUNTER — Ambulatory Visit
Admission: RE | Admit: 2021-06-27 | Discharge: 2021-06-27 | Disposition: A | Discharge status: Home / Self Care | Payer: Medicare Other | Source: Ambulatory Visit | Attending: Radiation Oncology | Admitting: Radiation Oncology

## 2021-06-27 DIAGNOSIS — C3491 Malignant neoplasm of unspecified part of right bronchus or lung: Secondary | ICD-10-CM | POA: Diagnosis not present

## 2021-06-30 ENCOUNTER — Telehealth: Payer: Self-pay | Admitting: *Deleted

## 2021-06-30 ENCOUNTER — Ambulatory Visit: Payer: Medicare Other

## 2021-06-30 NOTE — Telephone Encounter (Signed)
Pt's wife left message to inform our clinic that pt passed away early this morning. Per his wife, pt had an isolated coughing episode Friday evening and noticed pink-tinged mucus when coughing. Cough went away that night but then early this morning around 1am, pt was up and wife went to check on him and found him unconcious. Rescue was called but pt was not able to be revived.   Clinical team has been made aware and all appts have been cancelled.

## 2021-07-01 ENCOUNTER — Ambulatory Visit: Payer: Medicare Other

## 2021-07-02 ENCOUNTER — Ambulatory Visit: Payer: Medicare Other | Admitting: Dermatology

## 2021-07-09 ENCOUNTER — Ambulatory Visit: Payer: Medicare Other | Admitting: Oncology

## 2021-07-09 ENCOUNTER — Ambulatory Visit: Payer: Medicare Other

## 2021-07-09 ENCOUNTER — Other Ambulatory Visit: Payer: Medicare Other

## 2021-07-11 ENCOUNTER — Ambulatory Visit: Payer: Medicare Other

## 2021-07-17 DEATH — deceased

## 2021-07-23 ENCOUNTER — Other Ambulatory Visit: Payer: Medicare Other

## 2021-07-30 ENCOUNTER — Ambulatory Visit: Payer: Medicare Other | Admitting: Oncology

## 2021-07-30 ENCOUNTER — Other Ambulatory Visit: Payer: Medicare Other

## 2021-07-30 ENCOUNTER — Ambulatory Visit: Payer: Medicare Other

## 2021-08-01 ENCOUNTER — Ambulatory Visit: Payer: Medicare Other

## 2021-08-08 ENCOUNTER — Ambulatory Visit: Payer: Medicare Other | Admitting: Radiation Oncology

## 2023-01-17 IMAGING — MR MR HEAD WO/W CM
15 series · 44 of 48 positions shown · IV contrast (9ml Gadavist)
Comparison: None.

CLINICAL DATA: Left homonymous hemianopsia. Headache. Duration of
symptoms 6 weeks.

EXAM:
MRI HEAD WITHOUT AND WITH CONTRAST
TECHNIQUE: Multiplanar, multiecho pulse sequences of the brain and surrounding
structures were obtained without and with intravenous contrast.
CONTRAST:  9mL GADAVIST GADOBUTROL 1 MMOL/ML IV SOLN

[Series 5: ax dwi_tracew · axial · 3.0mm · 0.65mm/px · z∈[-76,+74]mm · 3 of 48 slices shown]
[im 1/48]
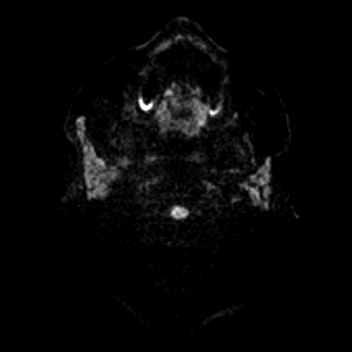
[im 24/48]
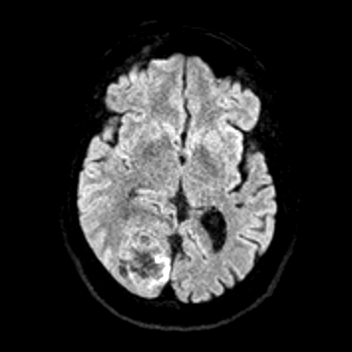
[im 48/48]
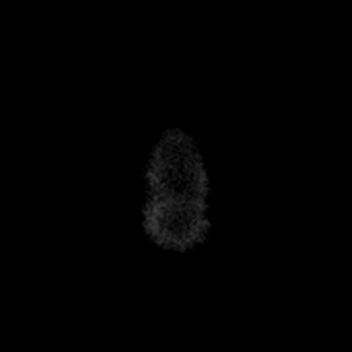

[Series 6: ax dwi_adc · axial · 3.0mm · 0.65mm/px · z∈[-76,+74]mm · 3 of 48 slices shown]
[im 1/48]
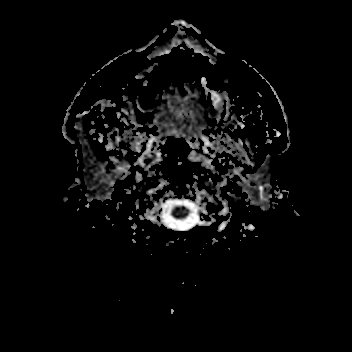
[im 24/48]
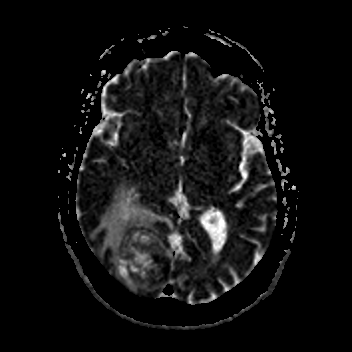
[im 48/48]
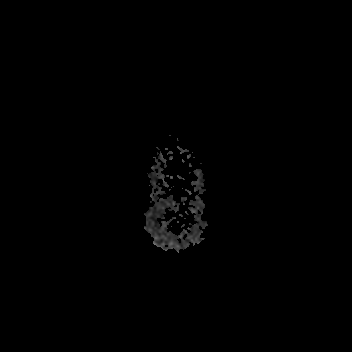

[Series 7: cor dwi_tracew · coronal · 5.0mm · 0.65mm/px · 2 of 40 slices shown]
[im 1/40]
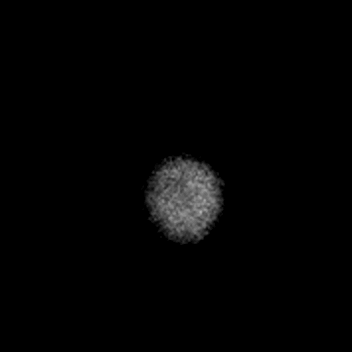
[im 40/40]
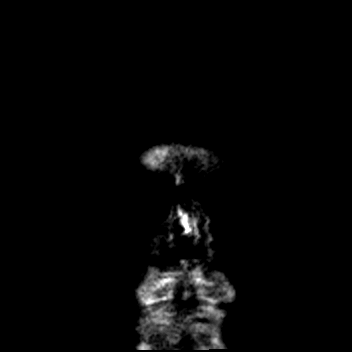

[Series 8: cor dwi_adc · coronal · 5.0mm · 0.65mm/px · 2 of 40 slices shown]
[im 1/40]
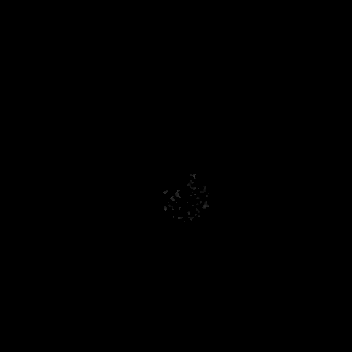
[im 40/40]
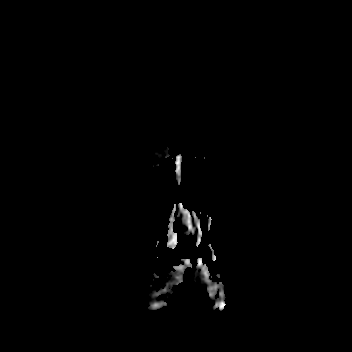

[Series 9: FLAIR · axial · 3.0mm · 0.53mm/px · z∈[-80,+77]mm · 3 of 55 slices shown]
[im 1/55]
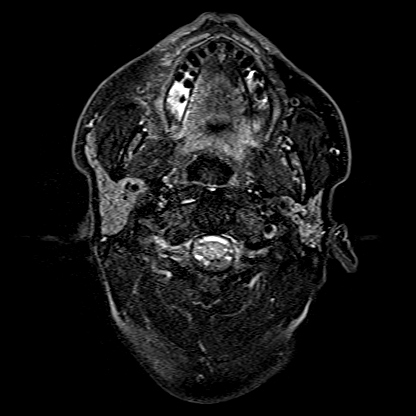
[im 28/55]
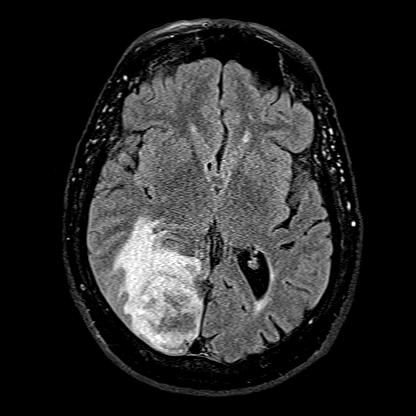
[im 55/55]
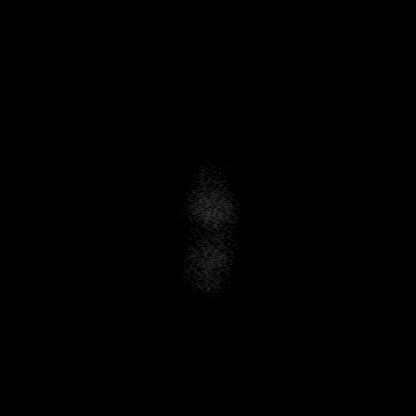

[Series 10: T2 · axial · 5.0mm · 0.53mm/px · 1 of 25 slices shown]
[im 1/25]
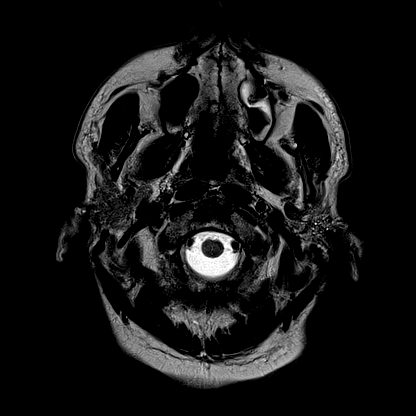

[Series 12: pha_images · axial · 3.0mm · 0.90mm/px · z∈[-85,+86]mm · 3 of 60 slices shown]
[im 1/60]
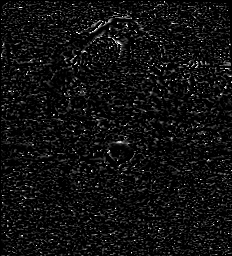
[im 30/60]
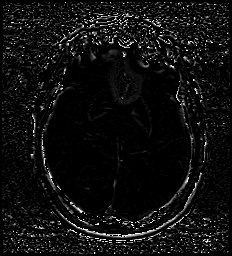
[im 60/60]
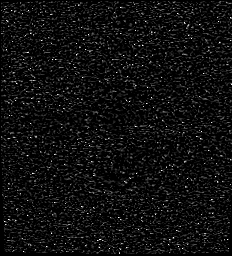

[Series 13: swi_images · axial · 3.0mm · 0.90mm/px · z∈[-85,+86]mm · 3 of 60 slices shown]
[im 1/60]
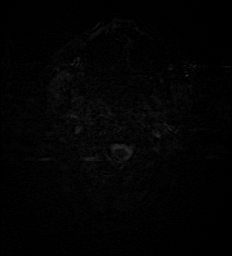
[im 30/60]
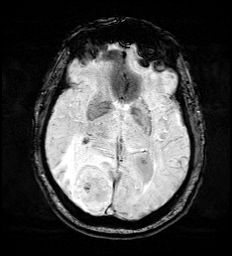
[im 60/60]
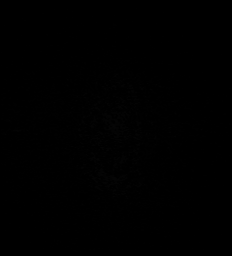

[Series 15: T1 · axial · 1.0mm · 0.98mm/px · z∈[-84,+86]mm · 8 of 176 slices shown (1 of 2)]
[im 1/176]
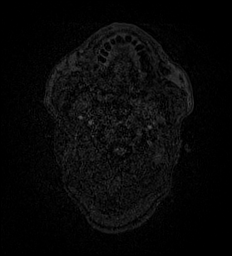
[im 20/176]
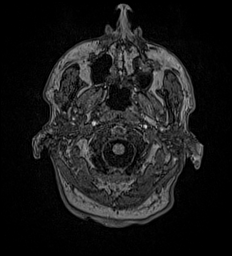
[im 59/176]
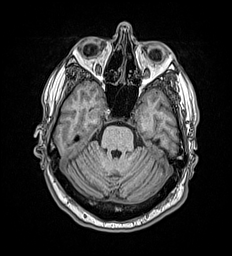
[im 78/176]
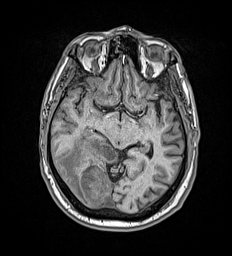
[im 98/176]
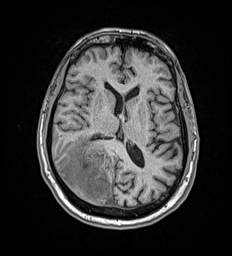
[im 117/176]
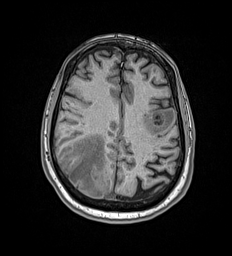
[im 156/176]
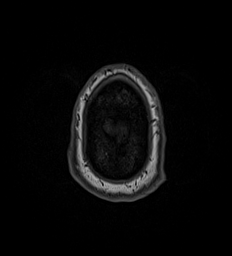
[im 176/176]
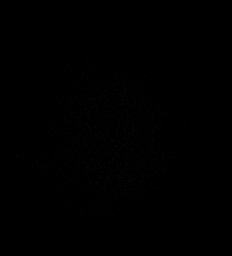

[Series 16: T1 · sagittal · 5.0mm · 0.62mm/px · 1 of 25 slices shown (2 of 2)]
[im 1/25]
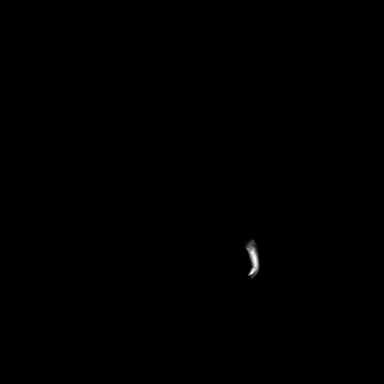

[Series 17: T2 post-contrast · coronal · 5.0mm · 0.57mm/px · 2 of 29 slices shown (1 of 2)]
[im 1/29]
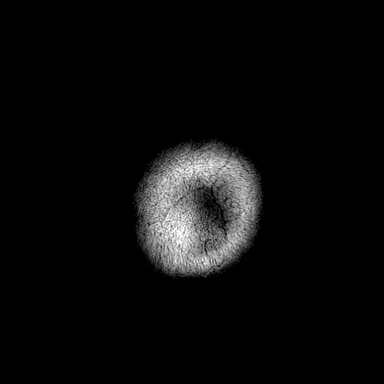
[im 29/29]
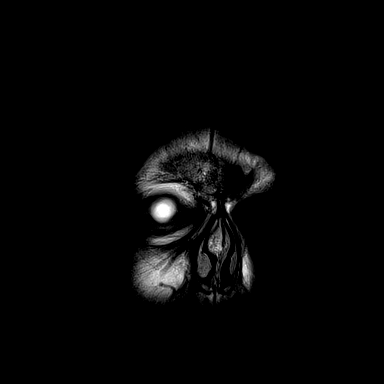

[Series 22: T1 post-contrast · axial · 1.0mm · 0.98mm/px · z∈[-61,+108]mm · 8 of 176 slices shown (1 of 3)]
[im 1/176]
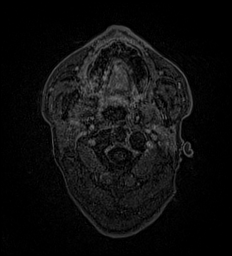
[im 20/176]
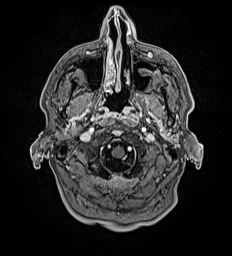
[im 59/176]
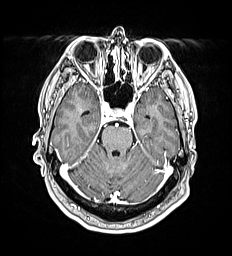
[im 78/176]
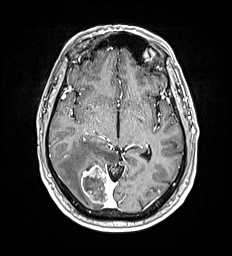
[im 98/176]
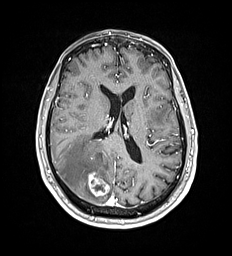
[im 117/176]
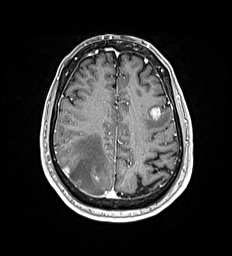
[im 156/176]
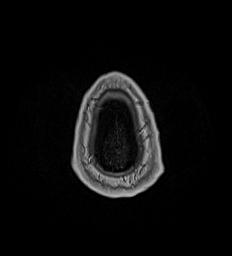
[im 176/176]
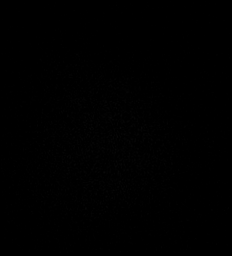

[Series 23: T1 post-contrast · coronal · 5.0mm · 0.57mm/px · 2 of 29 slices shown (2 of 3)]
[im 1/29]
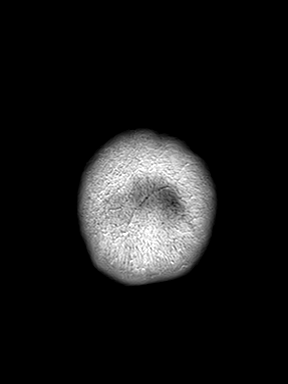
[im 29/29]
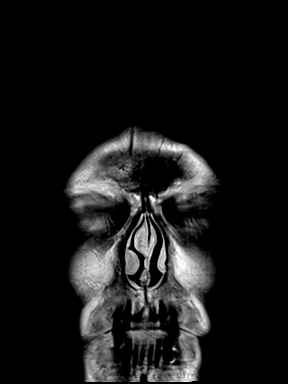

[Series 24: T1 post-contrast · sagittal · 5.0mm · 0.62mm/px · 1 of 25 slices shown (3 of 3)]
[im 1/25]
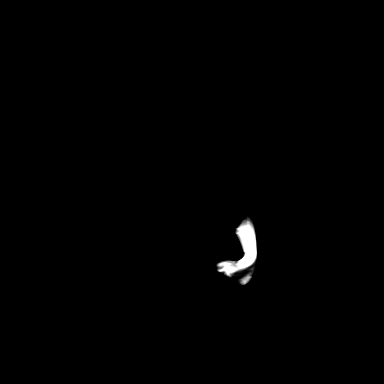

[Series 25: T2 post-contrast · coronal · 5.0mm · 0.57mm/px · 2 of 29 slices shown (2 of 2)]
[im 1/29]
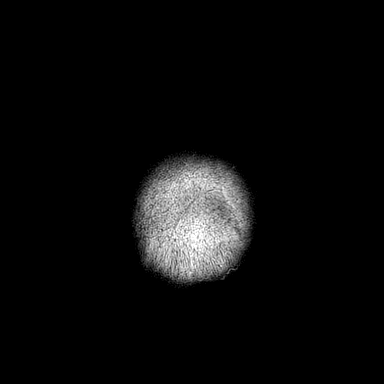
[im 29/29]
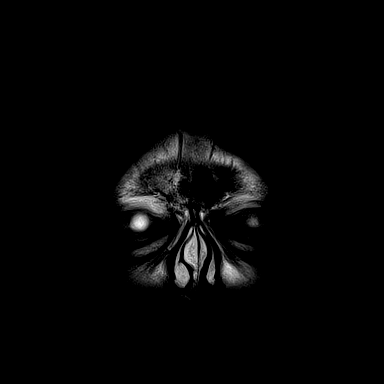

[44 of 48 positions shown; findings below may reference images not displayed]

FINDINGS: Brain: No sign of acute ischemic infarction.

No abnormality affects the brainstem, within the superomedial right
cerebellum, there is a 4 mm ring-enhancing lesion axial image 64
consistent with a metastasis. Within the right occipital lobe, there
is a necrotic metastasis measuring up to 4.2 cm in diameter with
pronounced regional vasogenic edema and mass effect. Broad surface
along the superior surface of the tentorium in the posteromedial
falx. No evidence of venous invasion of either the torcula or the
straight sinus. 6 mm metastasis in the left basal ganglia axial
image 89. 5 mm metastasis medial right parietal lobe axial image 94.
9 mm metastasis lateral posterior frontal region on the left axial
mm metastasis left posterior frontal lobe axial image 113. A mm
metastasis right lateral parietal lobe axial image 116. 3 mm
metastasis medial left parietal lobe axial image 120. 3 mm
metastasis medial left parietal lobe axial image 121. 3 mm
metastasis right frontal lobe axial image 123. 8 mm metastasis left
lateral posterior frontal region axial image 124. 2 mm metastasis
left posterior frontal lobe axial image 125. 3 mm metastasis right
frontoparietal junction axial image 129. 3 mm metastasis right
posterior frontal vertex axial image 139. 2 mm metastasis left
posterior frontal vertex axial image 139. This as up to 16 discrete
metastatic lesions. Vasogenic edema and mass effect or most
pronounced in the right occipital and parietal region but also
present within the left posterior frontal brain.

No hydrocephalus. No extra-axial collection. No skull or skull base
metastasis.

Vascular: Major vessels at the base of the brain show flow.

Skull and upper cervical spine: Negative

Sinuses/Orbits: Clear except for a retention cyst of the anterior
inferior left maxillary sinus. Orbits negative.

Other: None
IMPRESSION: Total of 16 metastases, 15 supra tentorial and 1 cerebellar. The
largest lesion is in the right occipital lobe measuring up to 4.2 cm
in diameter, with extensive regional vasogenic edema. This is likely
responsible for the visual symptoms. Mass-effect because of that
lesion and the associated edema. Vasogenic edema also present in the
left posterior frontal lobe associated with the 15 mm metastasis in
that region. Many the metastases show necrosis, more notable with
increasing lesion size.

These results will be called to the ordering clinician or
representative by the Radiologist Assistant, and communication
documented in the PACS or [REDACTED].
